# Patient Record
Sex: Male | Born: 1960
Health system: Southern US, Community
[De-identification: ages and names within clinical notes are randomized; demographics above are authoritative.]

## PROBLEM LIST (undated history)

## (undated) DIAGNOSIS — T7840XA Allergy, unspecified, initial encounter: Secondary | ICD-10-CM

## (undated) DIAGNOSIS — E785 Hyperlipidemia, unspecified: Secondary | ICD-10-CM

## (undated) DIAGNOSIS — H9319 Tinnitus, unspecified ear: Secondary | ICD-10-CM

## (undated) DIAGNOSIS — M542 Cervicalgia: Secondary | ICD-10-CM

## (undated) DIAGNOSIS — N200 Calculus of kidney: Secondary | ICD-10-CM

## (undated) HISTORY — DX: Allergy, unspecified, initial encounter: T78.40XA

## (undated) HISTORY — DX: Tinnitus, unspecified ear: H93.19

## (undated) HISTORY — DX: Cervicalgia: M54.2

## (undated) HISTORY — DX: Hyperlipidemia, unspecified: E78.5

---

## 1999-08-10 ENCOUNTER — Encounter: Payer: Self-pay | Admitting: Cardiology

## 1999-08-10 ENCOUNTER — Emergency Department (HOSPITAL_COMMUNITY): Admission: EM | Admit: 1999-08-10 | Discharge: 1999-08-10 | Payer: Self-pay | Admitting: Emergency Medicine

## 2000-12-07 ENCOUNTER — Encounter: Payer: Self-pay | Admitting: Emergency Medicine

## 2000-12-07 ENCOUNTER — Emergency Department (HOSPITAL_COMMUNITY): Admission: EM | Admit: 2000-12-07 | Discharge: 2000-12-07 | Payer: Self-pay | Admitting: Emergency Medicine

## 2002-04-22 ENCOUNTER — Encounter: Payer: Self-pay | Admitting: Emergency Medicine

## 2002-04-22 ENCOUNTER — Emergency Department (HOSPITAL_COMMUNITY): Admission: EM | Admit: 2002-04-22 | Discharge: 2002-04-22 | Payer: Self-pay | Admitting: *Deleted

## 2002-08-17 HISTORY — PX: OPEN REPAIR PERIARTICULAR FRACTURE / DISLOCATION ELBOW: SUR901

## 2008-11-01 ENCOUNTER — Emergency Department (HOSPITAL_COMMUNITY): Admission: EM | Admit: 2008-11-01 | Discharge: 2008-11-01 | Payer: Self-pay | Admitting: Emergency Medicine

## 2009-09-20 ENCOUNTER — Encounter: Admission: RE | Admit: 2009-09-20 | Discharge: 2009-09-20 | Payer: Self-pay | Admitting: Family Medicine

## 2010-06-04 ENCOUNTER — Ambulatory Visit (HOSPITAL_BASED_OUTPATIENT_CLINIC_OR_DEPARTMENT_OTHER): Admission: RE | Admit: 2010-06-04 | Discharge: 2010-06-04 | Payer: Self-pay | Admitting: Family Medicine

## 2010-06-04 ENCOUNTER — Ambulatory Visit: Payer: Self-pay | Admitting: Internal Medicine

## 2010-11-27 LAB — URINALYSIS, ROUTINE W REFLEX MICROSCOPIC
Bilirubin Urine: NEGATIVE
Ketones, ur: NEGATIVE mg/dL
Leukocytes, UA: NEGATIVE
Nitrite: NEGATIVE
Specific Gravity, Urine: 1.025 (ref 1.005–1.030)
Urobilinogen, UA: 0.2 mg/dL (ref 0.0–1.0)
pH: 7 (ref 5.0–8.0)

## 2010-11-27 LAB — URINE MICROSCOPIC-ADD ON

## 2010-11-27 LAB — POCT I-STAT, CHEM 8
Calcium, Ion: 1.2 mmol/L (ref 1.12–1.32)
HCT: 47 % (ref 39.0–52.0)
Hemoglobin: 16 g/dL (ref 13.0–17.0)
Sodium: 139 mEq/L (ref 135–145)
TCO2: 29 mmol/L (ref 0–100)

## 2010-11-27 LAB — DIFFERENTIAL
Basophils Absolute: 0 10*3/uL (ref 0.0–0.1)
Basophils Relative: 0 % (ref 0–1)
Eosinophils Absolute: 0 10*3/uL (ref 0.0–0.7)
Eosinophils Relative: 0 % (ref 0–5)
Monocytes Absolute: 0.2 10*3/uL (ref 0.1–1.0)

## 2010-11-27 LAB — CBC
HCT: 45.5 % (ref 39.0–52.0)
Hemoglobin: 15.2 g/dL (ref 13.0–17.0)
MCHC: 33.4 g/dL (ref 30.0–36.0)
Platelets: 220 10*3/uL (ref 150–400)
RDW: 13 % (ref 11.5–15.5)

## 2011-01-02 NOTE — Consult Note (Signed)
[facility]  Patient:    Chris Ali                       MRN: [id number] Proc. Date: 08/10/99 Adm. Date:  [id number] Attending:  Otilio Connors Iv CC:         Lewayne Bunting, M.D.                          Consultation Report  HISTORY OF PRESENT ILLNESS:  Mr. Micael Hampshire is a 50 year old white male with a history of vasodepressor syncope.  The patient presented to the emergency room today due to a feeling of dizziness and weakness over the last two days, particularly today hen the patient was standing for a long period of time in erect position in church.  The patient felt short of breath as well as dizzy; however, he never fainted. e has had no chest pain on exertion.  He does complain of some pleuritic pain on eep inspiration.  He does state that this pain improves on exertion.  The patient, about a week ago, climbed a 216-foot water tower and had absolutely no difficulty with this.  He has no orthopnea or PND.  He has had no palpitations.  PAST MEDICAL HISTORY:  Prior workup for dizziness approximately five years ago t Adult nurse office.  No further specific recommendations given.  ALLERGIES:  No known drug allergies.  MEDICATIONS:   Singulair and Claritin for questionable asthma.  SOCIAL HISTORY/FAMILY HISTORY:  The patient lives with his wife.  He does not smoke, occasionally drinks alcohol.  He denies any drug abuse.  There is no family history of sudden cardiac death.  There is no significant family history of coronary artery disease.  REVIEW OF SYSTEMS:  No fever or chills.  No hematemesis or hemoptysis.  PHYSICAL EXAMINATION:  VITAL SIGNS:  Blood pressure 120/72, heart rate 67 beats per minute, temperature afebrile.  NECK:  No JVD or abdominojugular reflux.  Normal carotid upstroke.  No carotid bruits.  Neck is supple.  LUNGS:  Clear breath sounds bilaterally.  HEART:  Regular rate and rhythm.  No murmurs, rubs, or  gallops.  ABDOMEN: Soft, nontender.  No rebound or guarding.  There are good bowel sounds.  EXTREMITIES:  Show 2+ peripheral pulses.  No cyanosis, clubbing, or edema.  GENITAL/RECTAL/NEUROLOGIC:  Examinations deferred.  LABORATORY DATA:  Glucose 89, BUN 11, sodium 140, potassium 3.7, chloride 104.  Anion gap 40.  Hematocrit 44, hemoglobin 15.  A pH of 7.4, PC02 40, bicarb 26, creatinine 0.2.  CK and CK-MB within normal limits with a total CK of 84. Relative index 0.8, troponin less than 0.03.  Chest x-ray within normal limits, no cardiomegaly, no pulmonary vascular congestion.  IMPRESSION AND PLAN: 1. Dizziness.  The patients symptoms are not consistent with angina.  He has no  evidence of myocardial injury by EKG.  He has negative troponins.  He has    limited cardiovascular risk factors.  The patient describes some substernal    chest pain which is rather pleuritic in nature, and I cannot exclude pleurisy in    this patient.  However, he had no rub on physical examination.  I have    instructed the patient to discontinue Singulair as this may be contributing o    dizziness.  Apparently the patient took a dose last night.  Without a firm    diagnosis of asthma,  I have instructed the patient that he should not be taking    this medication. 2. Disposition.  I have instructed the patient to take plenty of fluids, not to  use excessive alcohol in the next few days, and particularly avoid standing for  prolonged period of time in the same position.  I have also reassured him as he    appears to be anxious about his chest pain.  I have instructed him also to    follow up with me or to call be back as soon as possible if there are any    recurrent problems. DD:  08/10/99 TD:  08/11/99 Job: [id number]

## 2013-02-03 ENCOUNTER — Ambulatory Visit (INDEPENDENT_AMBULATORY_CARE_PROVIDER_SITE_OTHER): Payer: 59 | Admitting: Physician Assistant

## 2013-02-03 ENCOUNTER — Encounter: Payer: Self-pay | Admitting: Physician Assistant

## 2013-02-03 VITALS — BP 122/81 | HR 59 | Temp 97.7°F | Ht 66.0 in | Wt 166.0 lb

## 2013-02-03 DIAGNOSIS — H9319 Tinnitus, unspecified ear: Secondary | ICD-10-CM

## 2013-02-03 DIAGNOSIS — H9311 Tinnitus, right ear: Secondary | ICD-10-CM | POA: Insufficient documentation

## 2013-02-03 NOTE — Patient Instructions (Signed)
Tinnitus  Sounds you hear in your ears and coming from within the ear is called tinnitus. This can be a symptom of many ear disorders. It is often associated with hearing loss.   Tinnitus can be seen with:  · Infections.  · Ear blockages such as wax buildup.  · Meniere's disease.  · Ear damage.  · Inherited.  · Occupational causes.  While irritating, it is not usually a threat to health. When the cause of the tinnitus is wax, infection in the middle ear, or foreign body it is easily treated. Hearing loss will usually be reversible.   TREATMENT   When treating the underlying cause does not get rid of tinnitus, it may be necessary to get rid of the unwanted sound by covering it up with more pleasant background noises. This may include music, the radio etc. There are tinnitus maskers which can be worn which produce background noise to cover up the tinnitus.  Avoid all medications which tend to make tinnitus worse such as alcohol, caffeine, aspirin, and nicotine. There are many soothing background tapes such as rain, ocean, thunderstorms, etc. These soothing sounds help with sleeping or resting.  Keep all follow-up appointments and referrals. This is important to identify the cause of the problem. It also helps avoid complications, impaired hearing, disability, or chronic pain.  Document Released: 08/03/2005 Document Revised: 10/26/2011 Document Reviewed: 03/21/2008  ExitCare® Patient Information ©2014 [employer].

## 2013-02-03 NOTE — Progress Notes (Signed)
Subjective:     Patient ID: Chris Ali, male   DOB: Jan 31, 1961, 52 y.o.   MRN: [id number]  HPI Pt with a long intermit hx of tinnitus to the R ear Sx have become more freq recently He denies any definite hearing loss Pt with hx of being around loud noises No definite trauma to the ear itself  Review of Systems  All other systems reviewed and are negative.       Objective:   Physical Exam  Nursing note and vitals reviewed. Ears- ext ear nl bilat, canal nl, TM's with sl fluid but good landmarks Oral- no lesions No cerv nodes Hearing screen - see results       Assessment:     1. Tinnitus, right        Plan:     Refer to Audio for formalized testing F/U prn

## 2013-11-20 ENCOUNTER — Encounter: Payer: Self-pay | Admitting: Internal Medicine

## 2013-12-15 ENCOUNTER — Other Ambulatory Visit (INDEPENDENT_AMBULATORY_CARE_PROVIDER_SITE_OTHER): Payer: 59

## 2013-12-15 DIAGNOSIS — Z125 Encounter for screening for malignant neoplasm of prostate: Secondary | ICD-10-CM

## 2013-12-15 DIAGNOSIS — Z0000 Encounter for general adult medical examination without abnormal findings: Secondary | ICD-10-CM

## 2013-12-15 HISTORY — PX: COLONOSCOPY: SHX174

## 2013-12-15 LAB — POCT CBC
GRANULOCYTE PERCENT: 66.9 % (ref 37–80)
HCT, POC: 45.7 % (ref 43.5–53.7)
Hemoglobin: 14.9 g/dL (ref 14.1–18.1)
Lymph, poc: 1.5 (ref 0.6–3.4)
MCH, POC: 29.2 pg (ref 27–31.2)
MCHC: 32.7 g/dL (ref 31.8–35.4)
MCV: 89.4 fL (ref 80–97)
MPV: 8.9 fL (ref 0–99.8)
PLATELET COUNT, POC: 205 10*3/uL (ref 142–424)
POC GRANULOCYTE: 3.3 (ref 2–6.9)
POC LYMPH %: 30 % (ref 10–50)
RBC: 5.1 M/uL (ref 4.69–6.13)
RDW, POC: 12.9 %
WBC: 5 10*3/uL (ref 4.6–10.2)

## 2013-12-15 NOTE — Progress Notes (Signed)
Pt came in for labs only 

## 2013-12-16 LAB — BMP8+EGFR
BUN / CREAT RATIO: 15 (ref 9–20)
BUN: 15 mg/dL (ref 6–24)
CHLORIDE: 103 mmol/L (ref 97–108)
CO2: 23 mmol/L (ref 18–29)
Calcium: 9 mg/dL (ref 8.7–10.2)
Creatinine, Ser: 1.02 mg/dL (ref 0.76–1.27)
GFR calc Af Amer: 99 mL/min/{1.73_m2} (ref >59)
GFR calc non Af Amer: 85 mL/min/{1.73_m2} (ref >59)
GLUCOSE: 85 mg/dL (ref 65–99)
POTASSIUM: 4.2 mmol/L (ref 3.5–5.2)
Sodium: 140 mmol/L (ref 134–144)

## 2013-12-16 LAB — HEPATIC FUNCTION PANEL
ALBUMIN: 4.4 g/dL (ref 3.5–5.5)
ALT: 27 IU/L (ref 0–44)
AST: 20 IU/L (ref 0–40)
Alkaline Phosphatase: 47 IU/L (ref 39–117)
Bilirubin, Direct: 0.11 mg/dL (ref 0.00–0.40)
TOTAL PROTEIN: 6.9 g/dL (ref 6.0–8.5)
Total Bilirubin: 0.4 mg/dL (ref 0.0–1.2)

## 2013-12-16 LAB — PSA, TOTAL AND FREE
PSA FREE PCT: 15.7 %
PSA FREE: 0.11 ng/mL
PSA: 0.7 ng/mL (ref 0.0–4.0)

## 2013-12-16 LAB — NMR, LIPOPROFILE
Cholesterol: 198 mg/dL (ref <200)
HDL CHOLESTEROL BY NMR: 50 mg/dL (ref >=40)
HDL PARTICLE NUMBER: 29.8 umol/L — AB (ref >=30.5)
LDL PARTICLE NUMBER: 1392 nmol/L — AB (ref <1000)
LDL Size: 21.1 nm (ref >20.5)
LDLC SERPL CALC-MCNC: 135 mg/dL — AB (ref <100)
LP-IR SCORE: 31 (ref <=45)
Small LDL Particle Number: 388 nmol/L (ref <=527)
Triglycerides by NMR: 66 mg/dL (ref <150)

## 2013-12-16 LAB — VITAMIN D 25 HYDROXY (VIT D DEFICIENCY, FRACTURES): VIT D 25 HYDROXY: 22.5 ng/mL — AB (ref 30.0–100.0)

## 2013-12-21 ENCOUNTER — Ambulatory Visit (INDEPENDENT_AMBULATORY_CARE_PROVIDER_SITE_OTHER): Payer: 59 | Admitting: Family Medicine

## 2013-12-21 ENCOUNTER — Encounter: Payer: Self-pay | Admitting: Family Medicine

## 2013-12-21 VITALS — BP 127/82 | HR 61 | Temp 98.2°F | Ht 66.5 in | Wt 167.0 lb

## 2013-12-21 DIAGNOSIS — J309 Allergic rhinitis, unspecified: Secondary | ICD-10-CM

## 2013-12-21 DIAGNOSIS — Z0000 Encounter for general adult medical examination without abnormal findings: Secondary | ICD-10-CM

## 2013-12-21 DIAGNOSIS — N390 Urinary tract infection, site not specified: Secondary | ICD-10-CM

## 2013-12-21 DIAGNOSIS — R7989 Other specified abnormal findings of blood chemistry: Secondary | ICD-10-CM

## 2013-12-21 DIAGNOSIS — E559 Vitamin D deficiency, unspecified: Secondary | ICD-10-CM

## 2013-12-21 DIAGNOSIS — Z8249 Family history of ischemic heart disease and other diseases of the circulatory system: Secondary | ICD-10-CM

## 2013-12-21 DIAGNOSIS — N200 Calculus of kidney: Secondary | ICD-10-CM

## 2013-12-21 DIAGNOSIS — Z8719 Personal history of other diseases of the digestive system: Secondary | ICD-10-CM | POA: Insufficient documentation

## 2013-12-21 DIAGNOSIS — E785 Hyperlipidemia, unspecified: Secondary | ICD-10-CM

## 2013-12-21 LAB — POCT UA - MICROSCOPIC ONLY
CASTS, UR, LPF, POC: NEGATIVE
Crystals, Ur, HPF, POC: NEGATIVE
MUCUS UA: NEGATIVE
YEAST UA: NEGATIVE

## 2013-12-21 LAB — POCT URINALYSIS DIPSTICK
BILIRUBIN UA: NEGATIVE
GLUCOSE UA: NEGATIVE
Ketones, UA: NEGATIVE
NITRITE UA: NEGATIVE
Protein, UA: NEGATIVE
Spec Grav, UA: <=1.005
UROBILINOGEN UA: NEGATIVE
pH, UA: 8.5

## 2013-12-21 MED ORDER — VITAMIN D (ERGOCALCIFEROL) 1.25 MG (50000 UNIT) PO CAPS: 50000.0000 [IU] | ORAL_CAPSULE | ORAL | Status: DC

## 2013-12-21 MED ORDER — ROSUVASTATIN CALCIUM 10 MG PO TABS: 10.0000 mg | ORAL_TABLET | Freq: Every day | ORAL | Status: DC

## 2013-12-21 NOTE — Addendum Note (Signed)
Addended by: Pollyann Kennedy F on: 12/21/2013 11:35 AM   Modules accepted: Orders

## 2013-12-21 NOTE — Patient Instructions (Addendum)
Continue current medications. Continue good therapeutic lifestyle changes which include good diet and exercise. Fall precautions discussed with patient. If an FOBT was given today- please return it to our front desk. If you are over 51 years old - you may need Prevnar 48 or the adult Pneumonia vaccine.  Check on cost of shingles vaccine as well- ZOSTAVAX Vit D rx- sent to [facility] Please check your insurance further the Prevnar vaccine. Continued follow up with the audiologist We will arrange for a stress test. For your allergic rhinitis remember that Flonase and Nasacort are now over-the-counter

## 2013-12-21 NOTE — Progress Notes (Signed)
Subjective:    Patient ID: Chris Ali, male    DOB: [birth year], 53 y.o.   MRN: [id number]  HPI Patient is here today for annual wellness exam and follow up of chronic medical problems.the patient is to get a colonoscopy this month. He has had some back pain but this is better. He is new to get a cardiogram and a stress test. He is also due to get a Prevnar and shingles shot.          Patient Active Problem List   Diagnosis Date Noted  . Tinnitus of right ear 02/03/2013   Outpatient Encounter Prescriptions as of 12/21/2013  Medication Sig  . cetirizine (ZYRTEC) 10 MG tablet Take 10 mg by mouth at bedtime.    Review of Systems  Constitutional: Negative.   HENT: Negative.        Hearing loss right ear- has seen Cary Pahel  Eyes: Negative.   Respiratory: Negative.   Cardiovascular: Negative.   Gastrointestinal: Negative.   Endocrine: Negative.   Genitourinary: Negative.   Musculoskeletal: Positive for back pain.  Skin: Negative.   Allergic/Immunologic: Negative.   Neurological: Negative.   Hematological: Negative.   Psychiatric/Behavioral: Negative.        Objective:   Physical Exam  Nursing note and vitals reviewed. Constitutional: He is oriented to person, place, and time. He appears well-developed and well-nourished. No distress.  HENT:  Head: Normocephalic and atraumatic.  Right Ear: External ear normal.  Left Ear: External ear normal.  Nose: Nose normal.  Mouth/Throat: Oropharynx is clear and moist. No oropharyngeal exudate.  Minimal nasal congestion  Eyes: Conjunctivae and EOM are normal. Pupils are equal, round, and reactive to light. Right eye exhibits no discharge. Left eye exhibits no discharge. No scleral icterus.  Neck: Normal range of motion. Neck supple. No thyromegaly present.  No carotid bruit  Cardiovascular: Normal rate, regular rhythm, normal heart sounds and intact distal pulses.  Exam reveals no gallop and no friction rub.   No murmur  heard. Regular rate and rhythm at 72 per minute  Pulmonary/Chest: Effort normal and breath sounds normal. No respiratory distress. He has no wheezes. He has no rales. He exhibits no tenderness.  Abdominal: Soft. Bowel sounds are normal. He exhibits no mass. There is no tenderness. There is no rebound and no guarding.  Genitourinary: Rectum normal and penis normal.  The prostate is slightly enlarged. There are no lumps. There are no rectal masses. The external genitalia were within normal limits. There was no inguinal hernia palpated or inguinal nodes palpated.  Musculoskeletal: Normal range of motion. He exhibits no edema and no tenderness.  Lymphadenopathy:    He has no cervical adenopathy.  Neurological: He is alert and oriented to person, place, and time. He has normal reflexes. No cranial nerve deficit.  Skin: Skin is warm and dry. No rash noted. No erythema. No pallor.  Psychiatric: He has a normal mood and affect. His behavior is normal. Judgment and thought content normal.   BP 127/82  Pulse 61  Temp(Src) 98.2 F (36.8 C) (Oral)  Ht 5' 6.5" (1.689 m)  Wt 167 lb (75.751 kg)  BMI 26.55 kg/m2  EKG:  Bradycardia normal sinus rhythm       Assessment & Plan:  1. Annual physical exam - EKG 12-Lead - POCT UA - Microscopic Only - POCT urinalysis dipstick  2. Allergic rhinitis -Continue Zyrtec and as needed use of nasal steroid over the counter  3. Hyperlipidemia - Ambulatory  referral to Cardiology -Start Crestor 10 mg nightly -Liver function tests in 4 week  4. Nephrolithiasis  5. Vitamin D deficiency -Start vitamin D 50,000 units weekly  6. History of hiatal hernia  7. Family history of heart disease - Ambulatory referral to Cardiology  Meds ordered this encounter  Medications  . rosuvastatin (CRESTOR) 10 MG tablet    Sig: Take 1 tablet (10 mg total) by mouth daily.    Dispense:  90 tablet    Refill:  0  . Vitamin D, Ergocalciferol, (DRISDOL) 50000 UNITS CAPS  capsule    Sig: Take 1 capsule (50,000 Units total) by mouth every 7 (seven) days.    Dispense:  12 capsule    Refill:  1   Patient Instructions  Continue current medications. Continue good therapeutic lifestyle changes which include good diet and exercise. Fall precautions discussed with patient. If an FOBT was given today- please return it to our front desk. If you are over 17 years old - you may need Prevnar 62 or the adult Pneumonia vaccine.  Check on cost of shingles vaccine as well- ZOSTAVAX Vit D rx- sent to [place] Please check your insurance further the Prevnar vaccine. Continued follow up with the audiologist We will arrange for a stress test. For your allergic rhinitis remember that Flonase and Nasacort are now over-the-counter   Arrie Senate MD

## 2013-12-22 LAB — URINE CULTURE: Organism ID, Bacteria: NO GROWTH

## 2013-12-29 ENCOUNTER — Ambulatory Visit (AMBULATORY_SURGERY_CENTER): Payer: Self-pay | Admitting: *Deleted

## 2013-12-29 ENCOUNTER — Ambulatory Visit: Payer: Self-pay | Admitting: Cardiovascular Disease

## 2013-12-29 VITALS — Ht 66.5 in | Wt 170.6 lb

## 2013-12-29 DIAGNOSIS — Z1211 Encounter for screening for malignant neoplasm of colon: Secondary | ICD-10-CM

## 2013-12-29 MED ORDER — NA SULFATE-K SULFATE-MG SULF 17.5-3.13-1.6 GM/177ML PO SOLN: 1.0000 | Freq: Once | ORAL | Status: DC

## 2013-12-29 NOTE — Progress Notes (Signed)
Denies allergies to eggs or soy products. Denies complications with sedation or anesthesia. Denies O2 use. Denies use of diet or weight loss medications.  Emmi instructions given for colonoscopy.  

## 2014-01-03 ENCOUNTER — Encounter: Payer: Self-pay | Admitting: Internal Medicine

## 2014-01-05 ENCOUNTER — Encounter: Payer: Self-pay | Admitting: Cardiology

## 2014-01-05 ENCOUNTER — Ambulatory Visit (INDEPENDENT_AMBULATORY_CARE_PROVIDER_SITE_OTHER): Payer: 59 | Admitting: Cardiology

## 2014-01-05 VITALS — BP 127/77 | HR 54 | Ht 66.5 in | Wt 171.0 lb

## 2014-01-05 DIAGNOSIS — Z136 Encounter for screening for cardiovascular disorders: Secondary | ICD-10-CM

## 2014-01-05 DIAGNOSIS — E785 Hyperlipidemia, unspecified: Secondary | ICD-10-CM

## 2014-01-05 MED ORDER — ASPIRIN EC 81 MG PO TBEC: 81.0000 mg | DELAYED_RELEASE_TABLET | Freq: Every day | ORAL | Status: DC

## 2014-01-05 NOTE — Progress Notes (Signed)
Clinical Summary Chris Ali is a 53 y.o.male seen today as a new patient.   1. Cardiovascular risk assessment - Reports he runs up to a mile fairly regularly, typically runs 1/2 mile then stops for a break, and then runs second half. This is normal/stable for him.   Denies any chest pain. Denies any palpitations. No LE edema. No orthopnea.   CAD risk factors: hyperlipidemia, +FH Grandfather MI 58, maternal uncle MI 75. Remote brief history of tobacco history.   2. Hyperlipidemia - just started on crestor by pcp - 12/2013 panel: LDL 135 HDL 50 TG 66 TC 198   Past Medical History  Diagnosis Date  . Allergy   . Neck pain   . Tinnitus   . Hyperlipidemia      No Known Allergies   Current Outpatient Prescriptions  Medication Sig Dispense Refill  . cetirizine (ZYRTEC) 10 MG tablet Take 10 mg by mouth at bedtime.      . Na Sulfate-K Sulfate-Mg Sulf SOLN Take 1 kit by mouth once. suprep as directed. No substitutions.  354 mL  0  . rosuvastatin (CRESTOR) 10 MG tablet Take 1 tablet (10 mg total) by mouth daily.  90 tablet  0  . Vitamin D, Ergocalciferol, (DRISDOL) 50000 UNITS CAPS capsule Take 1 capsule (50,000 Units total) by mouth every 7 (seven) days.  12 capsule  1   No current facility-administered medications for this visit.     Past Surgical History  Procedure Laterality Date  . Open repair periarticular fracture / dislocation elbow Left 2004     No Known Allergies    Family History  Problem Relation Age of Onset  . Hypertension Mother   . Cancer Father     prostate  . Cancer Sister     breast  . Heart disease Maternal Grandmother   . Heart attack Maternal Grandmother 51  . Heart disease Maternal Grandfather   . Cancer Paternal Grandfather     colon  . Colon cancer Paternal Grandfather   . Esophageal cancer Neg Hx   . Rectal cancer Neg Hx   . Stomach cancer Neg Hx      Social History Chris Ali reports that he quit smoking about 25 years ago.  His smoking use included Cigarettes. He smoked 0.00 packs per day. He quit smokeless tobacco use about 25 years ago. Chris Ali reports that he drinks alcohol.   Review of Systems CONSTITUTIONAL: No weight loss, fever, chills, weakness or fatigue.  HEENT: Eyes: No visual loss, blurred vision, double vision or yellow sclerae.No hearing loss, sneezing, congestion, runny nose or sore throat.  SKIN: No rash or itching.  CARDIOVASCULAR: per HPI RESPIRATORY: No shortness of breath, cough or sputum.  GASTROINTESTINAL: No anorexia, nausea, vomiting or diarrhea. No abdominal pain or blood.  GENITOURINARY: No burning on urination, no polyuria NEUROLOGICAL: No headache, dizziness, syncope, paralysis, ataxia, numbness or tingling in the extremities. No change in bowel or bladder control.  MUSCULOSKELETAL: No muscle, back pain, joint pain or stiffness.  LYMPHATICS: No enlarged nodes. No history of splenectomy.  PSYCHIATRIC: No history of depression or anxiety.  ENDOCRINOLOGIC: No reports of sweating, cold or heat intolerance. No polyuria or polydipsia.  Marland Kitchen   Physical Examination p 54 bp 127/77 Wt 171 lbs BMI 27 Gen: resting comfortably, no acute distress HEENT: no scleral icterus, pupils equal round and reactive, no palptable cervical adenopathy,  CV: RRR, no m/r/g, no JVD, no carotid bruits Resp: Clear to auscultation bilaterally  GI: abdomen is soft, non-tender, non-distended, normal bowel sounds, no hepatosplenomegaly MSK: extremities are warm, no edema.  Skin: warm, no rash Neuro:  no focal deficits Psych: appropriate affect   Diagnostic Studies  12/21/13 EKG Sinus bradycardia rate 55   Assessment and Plan  1. Cardiovascular risk assessment - patient with fairly limited CAD risk factors, +FH, + HL, +gender/age.  - denies any current symptoms, he has stable exercise tolerance and tolerates high levels of exertion regularly with his running. - at this time focus of primary prevention is  risk factor modification. Agree with initiation of statin. Given his risk factors, will start ASA 39m daily. - no indication for non-invasive testing given the absence of any cardiac symptoms  2. Hyperlipidemia - initiated on statin just recently, followed by pcp Dr MLaurance Flatten  F/u 1 year      JArnoldo Lenis M.D., F.A.C.C.

## 2014-01-05 NOTE — Patient Instructions (Signed)
   Begin Aspirin 81mg daily.   Continue all other medications.   Your physician wants you to follow up in:  1 year.  You will receive a reminder letter in the mail one-two months in advance.  If you don't receive a letter, please call our office to schedule the follow up appointment   

## 2014-01-12 ENCOUNTER — Ambulatory Visit (AMBULATORY_SURGERY_CENTER): Payer: 59 | Admitting: Internal Medicine

## 2014-01-12 ENCOUNTER — Encounter: Payer: Self-pay | Admitting: Internal Medicine

## 2014-01-12 VITALS — BP 128/80 | HR 68 | Temp 97.2°F | Resp 15 | Ht 66.0 in | Wt 171.0 lb

## 2014-01-12 DIAGNOSIS — Z1211 Encounter for screening for malignant neoplasm of colon: Secondary | ICD-10-CM

## 2014-01-12 MED ORDER — SODIUM CHLORIDE 0.9 % IV SOLN: 500.0000 mL | INTRAVENOUS | Status: DC

## 2014-01-12 NOTE — Op Note (Signed)
[facility]  [address] [place], [address]   COLONOSCOPY PROCEDURE REPORT  PATIENT: Chris Ali, Chris Ali  MR#: [id number] BIRTHDATE: June 30, 1961 , 52  yrs. old GENDER: Male ENDOSCOPIST: Gatha Mayer, MD, [facility] PROCEDURE DATE:  01/12/2014 PROCEDURE:   Colonoscopy, screening First Screening Colonoscopy - Avg.  risk and is 50 yrs.  old or older Yes.  Prior Negative Screening - Now for repeat screening. N/A  History of Adenoma - Now for follow-up colonoscopy & has been > or = to 3 yrs.  N/A  Polyps Removed Today? No.  Recommend repeat exam, <10 yrs? No. ASA CLASS:   Class II INDICATIONS:average risk screening and first colonoscopy. MEDICATIONS: Propofol (Diprivan) 220 mg IV, MAC sedation, administered by CRNA, and These medications were titrated to patient response per physician's verbal order  DESCRIPTION OF PROCEDURE:   After the risks benefits and alternatives of the procedure were thoroughly explained, informed consent was obtained.  A digital rectal exam revealed no abnormalities of the rectum.   The LB [id number]  endoscope was introduced through the anus and advanced to the cecum, which was identified by both the appendix and ileocecal valve. No adverse events experienced.   The quality of the prep was excellent using Suprep  The instrument was then slowly withdrawn as the colon was fully examined.      COLON FINDINGS: A normal appearing cecum, ileocecal valve, and appendiceal orifice were identified.  The ascending, hepatic flexure, transverse, splenic flexure, descending, sigmoid colon and rectum appeared unremarkable.  No polyps or cancers were seen.   A right colon retroflexion was performed.  Retroflexed views revealed no abnormalities. The time to cecum=1 minutes 37 seconds. Withdrawal time=8 minutes 09 seconds.  The scope was withdrawn and the procedure completed. COMPLICATIONS: There were no complications.  ENDOSCOPIC IMPRESSION: Normal  colonoscopy - excellent prep - first screening  RECOMMENDATIONS: Repeat colonoscopy 10 years - 2025   eSigned:  Gatha Mayer, MD, [facility] 01/12/2014 3:34 PM   cc: The Patient and Redge Gainer, MD

## 2014-01-12 NOTE — Patient Instructions (Signed)
YOU HAD AN ENDOSCOPIC PROCEDURE TODAY AT THE [facility]: Refer to the procedure report that was given to you for any specific questions about what was found during the examination.  If the procedure report does not answer your questions, please call your gastroenterologist to clarify.  If you requested that your care partner not be given the details of your procedure findings, then the procedure report has been included in a sealed envelope for you to review at your convenience later.  YOU SHOULD EXPECT: Some feelings of bloating in the abdomen. Passage of more gas than usual.  Walking can help get rid of the air that was put into your GI tract during the procedure and reduce the bloating. If you had a lower endoscopy (such as a colonoscopy or flexible sigmoidoscopy) you may notice spotting of blood in your stool or on the toilet paper. If you underwent a bowel prep for your procedure, then you may not have a normal bowel movement for a few days.  DIET: Your first meal following the procedure should be a light meal and then it is ok to progress to your normal diet.  A half-sandwich or bowl of soup is an example of a good first meal.  Heavy or fried foods are harder to digest and may make you feel nauseous or bloated.  Likewise meals heavy in dairy and vegetables can cause extra gas to form and this can also increase the bloating.  Drink plenty of fluids but you should avoid alcoholic beverages for 24 hours.  ACTIVITY: Your care partner should take you home directly after the procedure.  You should plan to take it easy, moving slowly for the rest of the day.  You can resume normal activity the day after the procedure however you should NOT DRIVE or use heavy machinery for 24 hours (because of the sedation medicines used during the test).    SYMPTOMS TO REPORT IMMEDIATELY: A gastroenterologist can be reached at any hour.  During normal business hours, 8:30 AM to 5:00 PM Monday through Friday,  call [phone].  After hours and on weekends, please call the GI answering service at [phone] who will take a message and have the physician on call contact you.   Following lower endoscopy (colonoscopy or flexible sigmoidoscopy):  Excessive amounts of blood in the stool  Significant tenderness or worsening of abdominal pains  Swelling of the abdomen that is new, acute  Fever of 100F or higher  FOLLOW UP: If any biopsies were taken you will be contacted by phone or by letter within the next 1-3 weeks.  Call your gastroenterologist if you have not heard about the biopsies in 3 weeks.  Our staff will call the home number listed on your records the next business day following your procedure to check on you and address any questions or concerns that you may have at that time regarding the information given to you following your procedure. This is a courtesy call and so if there is no answer at the home number and we have not heard from you through the emergency physician on call, we will assume that you have returned to your regular daily activities without incident.  SIGNATURES/CONFIDENTIALITY: You and/or your care partner have signed paperwork which will be entered into your electronic medical record.  These signatures attest to the fact that that the information above on your After Visit Summary has been reviewed and is understood.  Full responsibility of the confidentiality of this   discharge information lies with you and/or your care-partner.    Normal colonoscopy.  Repeat colonoscopy in 10 years-2025

## 2014-01-12 NOTE — Progress Notes (Signed)
Report to PACU, RN, vss, BBS= Clear.  

## 2014-01-15 ENCOUNTER — Telehealth: Payer: Self-pay | Admitting: *Deleted

## 2014-01-15 NOTE — Telephone Encounter (Signed)
  Follow up Call-  Call back number 01/12/2014  Post procedure Call Back phone  # [phone]  Permission to leave phone message Yes     Patient questions:  Do you have a fever, pain , or abdominal swelling? no Pain Score  0 *  Have you tolerated food without any problems? yes  Have you been able to return to your normal activities? yes  Do you have any questions about your discharge instructions: Diet   no Medications  no Follow up visit  no  Do you have questions or concerns about your Care? no  Actions: * If pain score is 4 or above: No action needed, pain <4.

## 2014-01-15 NOTE — Telephone Encounter (Signed)
  Follow up Call-  Call back number 01/12/2014  Post procedure Call Back phone  # [phone]  Permission to leave phone message Yes     Patient questions:  Do you have a fever, pain , or abdominal swelling? no Pain Score  0 *  Have you tolerated food without any problems? yes  Have you been able to return to your normal activities? yes  Do you have any questions about your discharge instructions: Diet   no Medications  no Follow up visit  no  Do you have questions or concerns about your Care? no  Actions: * If pain score is 4 or above: No action needed, pain <4.   

## 2014-06-06 ENCOUNTER — Encounter: Payer: Self-pay | Admitting: Family Medicine

## 2014-06-06 ENCOUNTER — Ambulatory Visit (INDEPENDENT_AMBULATORY_CARE_PROVIDER_SITE_OTHER): Payer: 59 | Admitting: Family Medicine

## 2014-06-06 VITALS — BP 127/79 | HR 96 | Temp 98.4°F | Ht 66.0 in | Wt 168.0 lb

## 2014-06-06 DIAGNOSIS — J0100 Acute maxillary sinusitis, unspecified: Secondary | ICD-10-CM

## 2014-06-06 MED ORDER — AMOXICILLIN 875 MG PO TABS: 875.0000 mg | ORAL_TABLET | Freq: Two times a day (BID) | ORAL | Status: DC

## 2014-06-06 NOTE — Progress Notes (Signed)
Subjective:     Patient ID: Chris Ali, male   DOB: 08-19-1960, 53 y.o.   MRN: [id number]  HPI This 53 y.o. male presents for evaluation of c/o sinus congestion and facial discomfort. He has been having some mucopurulent drainage and Uri sx's for over 2 weeks.  Review of Systems C/o sinus congestion and facial discomfort   No chest pain, SOB, HA, dizziness, vision change, N/V, diarrhea, constipation, dysuria, urinary urgency or frequency, myalgias, arthralgias or rash.  Objective:   Physical Exam Vital signs noted  Well developed well nourished male.  HEENT - Head atraumatic Normocephalic                Eyes - PERRLA, Conjuctiva - clear Sclera- Clear EOMI                Ears - EAC's Wnl TM's Wnl Gross Hearing WNL                Nose - Nares patent                 Throat - oropharanx wnl                Face - TTP maxillary sinuses Respiratory - Lungs CTA bilateral Cardiac - RRR S1 and S2 without murmur GI - Abdomen soft Nontender and bowel sounds active x 4 Extremities - No edema. Neuro - Grossly intact.    Assessment:    Subacute maxillary sinusitis - Plan: amoxicillin (AMOXIL) 875 MG tablet Push po fluids, rest, tylenol and motrin otc prn as directed for fever, arthralgias, and myalgias.  Follow up prn if sx's continue or persist.  Lysbeth Penner FNP     Plan:       See above

## 2014-06-29 ENCOUNTER — Encounter (INDEPENDENT_AMBULATORY_CARE_PROVIDER_SITE_OTHER): Payer: Self-pay

## 2014-06-29 ENCOUNTER — Ambulatory Visit (INDEPENDENT_AMBULATORY_CARE_PROVIDER_SITE_OTHER): Payer: 59 | Admitting: Family Medicine

## 2014-06-29 ENCOUNTER — Encounter: Payer: Self-pay | Admitting: Family Medicine

## 2014-06-29 VITALS — BP 113/76 | HR 70 | Temp 97.7°F | Ht 66.0 in | Wt 167.0 lb

## 2014-06-29 DIAGNOSIS — E785 Hyperlipidemia, unspecified: Secondary | ICD-10-CM

## 2014-06-29 DIAGNOSIS — J301 Allergic rhinitis due to pollen: Secondary | ICD-10-CM

## 2014-06-29 DIAGNOSIS — M791 Myalgia, unspecified site: Secondary | ICD-10-CM

## 2014-06-29 DIAGNOSIS — Z8249 Family history of ischemic heart disease and other diseases of the circulatory system: Secondary | ICD-10-CM

## 2014-06-29 DIAGNOSIS — E559 Vitamin D deficiency, unspecified: Secondary | ICD-10-CM

## 2014-06-29 LAB — POCT CBC
Granulocyte percent: 68.1 %G (ref 37–80)
HEMATOCRIT: 44.6 % (ref 43.5–53.7)
HEMOGLOBIN: 14.8 g/dL (ref 14.1–18.1)
LYMPH, POC: 1.5 (ref 0.6–3.4)
MCH, POC: 29.4 pg (ref 27–31.2)
MCHC: 33.2 g/dL (ref 31.8–35.4)
MCV: 88.5 fL (ref 80–97)
MPV: 7.8 fL (ref 0–99.8)
POC GRANULOCYTE: 3.8 (ref 2–6.9)
POC LYMPH %: 27.2 % (ref 10–50)
Platelet Count, POC: 210 10*3/uL (ref 142–424)
RBC: 5 M/uL (ref 4.69–6.13)
RDW, POC: 13.6 %
WBC: 5.6 10*3/uL (ref 4.6–10.2)

## 2014-06-29 NOTE — Progress Notes (Signed)
Subjective:    Patient ID: Chris Ali, male    DOB: [birth year], 53 y.o.   MRN: [id number]  HPI Pt here for follow up and management of chronic medical problems.the patient developed myalgias with his statin drug and he discontinued it because of this reason. Both his parents have hyperlipidemia. We will see what his lab work is without taking a statin drug and consider trying another one if cholesterol numbers remain elevated.           Patient Active Problem List   Diagnosis Date Noted  . Allergic rhinitis 12/21/2013  . Hyperlipidemia 12/21/2013  . History of nephrolithiasis 12/21/2013  . Vitamin D deficiency 12/21/2013  . History of hiatal hernia 12/21/2013  . Tinnitus of right ear 02/03/2013   Outpatient Encounter Prescriptions as of 06/29/2014  Medication Sig  . cetirizine (ZYRTEC) 10 MG tablet Take 10 mg by mouth at bedtime.  . [DISCONTINUED] amoxicillin (AMOXIL) 875 MG tablet Take 1 tablet (875 mg total) by mouth 2 (two) times daily.  . [DISCONTINUED] aspirin EC 81 MG tablet Take 1 tablet (81 mg total) by mouth daily.    Review of Systems  Constitutional: Negative.   HENT: Negative.   Eyes: Negative.   Respiratory: Negative.   Cardiovascular: Negative.   Gastrointestinal: Negative.   Endocrine: Negative.   Genitourinary: Negative.   Musculoskeletal: Positive for myalgias (stopped cholesterol med- now better).  Skin: Negative.   Allergic/Immunologic: Negative.   Neurological: Negative.   Hematological: Negative.   Psychiatric/Behavioral: Negative.        Objective:   Physical Exam  Constitutional: He is oriented to person, place, and time. He appears well-developed and well-nourished. No distress.  HENT:  Head: Normocephalic and atraumatic.  Right Ear: External ear normal.  Left Ear: External ear normal.  Mouth/Throat: Oropharynx is clear and moist. No oropharyngeal exudate.  Minimal nasal congestion bilaterally  Eyes: Conjunctivae and EOM are  normal. Pupils are equal, round, and reactive to light. Right eye exhibits no discharge. Left eye exhibits no discharge. No scleral icterus.  Neck: Normal range of motion. Neck supple. No thyromegaly present.  No carotid bruits or anterior cervical adenopathy  Cardiovascular: Normal rate, regular rhythm and intact distal pulses.   No murmur heard. At 72/m  Pulmonary/Chest: Effort normal and breath sounds normal. No respiratory distress. He has no wheezes. He has no rales. He exhibits no tenderness.  Abdominal: Soft. Bowel sounds are normal. He exhibits no mass. There is no tenderness. There is no rebound and no guarding.  Musculoskeletal: Normal range of motion. He exhibits no edema or tenderness.  Lymphadenopathy:    He has no cervical adenopathy.  Neurological: He is alert and oriented to person, place, and time. He has normal reflexes.  Skin: Skin is warm and dry. No rash noted. No erythema. No pallor.  Psychiatric: He has a normal mood and affect. His behavior is normal. Judgment and thought content normal.  Nursing note and vitals reviewed.  BP 113/76 mmHg  Pulse 70  Temp(Src) 97.7 F (36.5 C) (Oral)  Ht _0  (1.676 m)  Wt 167 lb (75.751 kg)  BMI 26.97 kg/m2        Assessment & Plan:  1. Hyperlipidemia - POCT CBC - NMR, lipoprofile - BMP8+EGFR - Hepatic function panel  2. Vitamin D deficiency - POCT CBC - Vit D  25 hydroxy (rtn osteoporosis monitoring)  3. Family history of heart disease - POCT CBC - BMP8+EGFR - Hepatic function panel  4.  Myalgia - CK  5. Allergic rhinitis due to pollen -continue Zyrtec and periodic use of Flonase  6.statin intolerant  No orders of the defined types were placed in this encounter.   Patient Instructions  Continue current medications. Continue good therapeutic lifestyle changes which include good diet and exercise. Fall precautions discussed with patient. If an FOBT was given today- please return it to our front desk. If  you are over 24 years old - you may need Prevnar 47 or the adult Pneumonia vaccine.  Flu Shots will be available at our office starting mid- September. Please call and schedule a FLU CLINIC APPOINTMENT.   Continue aggressive therapeutic lifestyle changes which include diet and exercise We will call you with your lab work results and may consider trying a different statin drug to lower your cholesterol at that time Continue your current allergy medication and consider trying or adding Flonase in February and August for peak allergy control during those peak seasons.   Arrie Senate MD

## 2014-06-29 NOTE — Patient Instructions (Addendum)
Continue current medications. Continue good therapeutic lifestyle changes which include good diet and exercise. Fall precautions discussed with patient. If an FOBT was given today- please return it to our front desk. If you are over 51 years old - you may need Prevnar 36 or the adult Pneumonia vaccine.  Flu Shots will be available at our office starting mid- September. Please call and schedule a FLU CLINIC APPOINTMENT.   Continue aggressive therapeutic lifestyle changes which include diet and exercise We will call you with your lab work results and may consider trying a different statin drug to lower your cholesterol at that time Continue your current allergy medication and consider trying or adding Flonase in February and August for peak allergy control during those peak seasons.

## 2014-06-30 LAB — BMP8+EGFR
BUN/Creatinine Ratio: 11 (ref 9–20)
BUN: 13 mg/dL (ref 6–24)
CO2: 25 mmol/L (ref 18–29)
CREATININE: 1.14 mg/dL (ref 0.76–1.27)
Calcium: 9.5 mg/dL (ref 8.7–10.2)
Chloride: 100 mmol/L (ref 97–108)
GFR calc Af Amer: 86 mL/min/{1.73_m2} (ref >59)
GFR calc non Af Amer: 74 mL/min/{1.73_m2} (ref >59)
Glucose: 89 mg/dL (ref 65–99)
POTASSIUM: 4.1 mmol/L (ref 3.5–5.2)
SODIUM: 140 mmol/L (ref 134–144)

## 2014-06-30 LAB — NMR, LIPOPROFILE
CHOLESTEROL: 244 mg/dL — AB (ref 100–199)
HDL Cholesterol by NMR: 48 mg/dL (ref >39)
HDL PARTICLE NUMBER: 29.4 umol/L — AB (ref >=30.5)
LDL Particle Number: 1557 nmol/L — ABNORMAL HIGH (ref <1000)
LDL SIZE: 21.1 nm (ref >20.5)
LDL-C: 168 mg/dL — ABNORMAL HIGH (ref 0–99)
LP-IR Score: 41 (ref <=45)
Small LDL Particle Number: 213 nmol/L (ref <=527)
Triglycerides by NMR: 138 mg/dL (ref 0–149)

## 2014-06-30 LAB — HEPATIC FUNCTION PANEL
ALT: 41 IU/L (ref 0–44)
AST: 27 IU/L (ref 0–40)
Albumin: 4.4 g/dL (ref 3.5–5.5)
Alkaline Phosphatase: 53 IU/L (ref 39–117)
BILIRUBIN TOTAL: 0.4 mg/dL (ref 0.0–1.2)
Bilirubin, Direct: 0.11 mg/dL (ref 0.00–0.40)
Total Protein: 6.5 g/dL (ref 6.0–8.5)

## 2014-06-30 LAB — VITAMIN D 25 HYDROXY (VIT D DEFICIENCY, FRACTURES): Vit D, 25-Hydroxy: 23.5 ng/mL — ABNORMAL LOW (ref 30.0–100.0)

## 2014-06-30 LAB — CK: Total CK: 95 U/L (ref 24–204)

## 2014-09-18 ENCOUNTER — Encounter: Payer: Self-pay | Admitting: *Deleted

## 2014-11-07 ENCOUNTER — Other Ambulatory Visit: Payer: Self-pay

## 2014-11-07 MED ORDER — ROSUVASTATIN CALCIUM 10 MG PO TABS: 10.0000 mg | ORAL_TABLET | Freq: Every day | ORAL | Status: DC

## 2014-11-07 NOTE — Telephone Encounter (Signed)
Please give this patient a coupon for 3 months for $3 of Crestor with refills

## 2014-11-07 NOTE — Telephone Encounter (Signed)
Last seen 06/19/14 DWM  This med not on [facility] list  Patient requesting a generic substitute

## 2014-11-08 ENCOUNTER — Telehealth: Payer: Self-pay | Admitting: *Deleted

## 2014-11-08 MED ORDER — ATORVASTATIN CALCIUM 20 MG PO TABS: 20.0000 mg | ORAL_TABLET | Freq: Every day | ORAL | Status: DC

## 2014-11-08 NOTE — Telephone Encounter (Signed)
Change patient to atorvastatin 20 mg 1 daily. Check liver function tests in 4 weeks and likely med panel in 3 months

## 2014-11-08 NOTE — Telephone Encounter (Signed)
Stew at the pharmacy said the copay for 30 days is $182, even with the $3 card. Please change to atorvastatin or simvastatin

## 2014-12-27 ENCOUNTER — Ambulatory Visit: Payer: 59 | Admitting: Family Medicine

## 2014-12-28 ENCOUNTER — Ambulatory Visit (INDEPENDENT_AMBULATORY_CARE_PROVIDER_SITE_OTHER): Payer: 59 | Admitting: Family Medicine

## 2014-12-28 ENCOUNTER — Encounter: Payer: Self-pay | Admitting: Family Medicine

## 2014-12-28 ENCOUNTER — Ambulatory Visit (INDEPENDENT_AMBULATORY_CARE_PROVIDER_SITE_OTHER): Payer: 59

## 2014-12-28 ENCOUNTER — Encounter: Payer: Self-pay | Admitting: *Deleted

## 2014-12-28 VITALS — BP 125/79 | HR 66 | Temp 97.5°F | Ht 66.0 in | Wt 168.0 lb

## 2014-12-28 DIAGNOSIS — E559 Vitamin D deficiency, unspecified: Secondary | ICD-10-CM

## 2014-12-28 DIAGNOSIS — Z8249 Family history of ischemic heart disease and other diseases of the circulatory system: Secondary | ICD-10-CM | POA: Diagnosis not present

## 2014-12-28 DIAGNOSIS — Z0000 Encounter for general adult medical examination without abnormal findings: Secondary | ICD-10-CM

## 2014-12-28 DIAGNOSIS — E785 Hyperlipidemia, unspecified: Secondary | ICD-10-CM

## 2014-12-28 DIAGNOSIS — S76219A Strain of adductor muscle, fascia and tendon of unspecified thigh, initial encounter: Secondary | ICD-10-CM

## 2014-12-28 DIAGNOSIS — N400 Enlarged prostate without lower urinary tract symptoms: Secondary | ICD-10-CM

## 2014-12-28 LAB — POCT CBC
Granulocyte percent: 68 %G (ref 37–80)
HCT, POC: 47.3 % (ref 43.5–53.7)
Hemoglobin: 15.5 g/dL (ref 14.1–18.1)
Lymph, poc: 1.3 (ref 0.6–3.4)
MCH: 29.5 pg (ref 27–31.2)
MCHC: 32.7 g/dL (ref 31.8–35.4)
MCV: 90.1 fL (ref 80–97)
MPV: 8 fL (ref 0–99.8)
POC Granulocyte: 3.8 (ref 2–6.9)
POC LYMPH %: 23 % (ref 10–50)
Platelet Count, POC: 196 10*3/uL (ref 142–424)
RBC: 5.25 M/uL (ref 4.69–6.13)
RDW, POC: 13 %
WBC: 5.6 10*3/uL (ref 4.6–10.2)

## 2014-12-28 LAB — POCT URINALYSIS DIPSTICK
BILIRUBIN UA: NEGATIVE
Blood, UA: NEGATIVE
Glucose, UA: NEGATIVE
Ketones, UA: NEGATIVE
Leukocytes, UA: NEGATIVE
NITRITE UA: NEGATIVE
Spec Grav, UA: 1.01
UROBILINOGEN UA: NEGATIVE
pH, UA: 8.5

## 2014-12-28 LAB — POCT UA - MICROSCOPIC ONLY
Bacteria, U Microscopic: NEGATIVE
Casts, Ur, LPF, POC: NEGATIVE
Crystals, Ur, HPF, POC: NEGATIVE
WBC, Ur, HPF, POC: NEGATIVE
Yeast, UA: NEGATIVE

## 2014-12-28 NOTE — Progress Notes (Signed)
Subjective:    Patient ID: Chris Ali, male    DOB: 01/09/1961, 54 y.o.   MRN: [id number]  HPI Patient is here today for annual wellness exam and follow up of chronic medical problems which includes hyperlipidemia and vit d def. the patient also complains of a knot in the left testicle. The patient denies chest pain shortness of breath trouble swallowing indigestion and heartburn blood in the stool or black tarry bowel movements. He has no problems voiding and his sexual function is normal. He indicates that 2-3 days ago that he did a lot of pushing and straining and subsequently noted the soreness in his left testicle area. He said this happened once before and got better on its own.     Patient Active Problem List   Diagnosis Date Noted  . Allergic rhinitis 12/21/2013  . Hyperlipidemia 12/21/2013  . History of nephrolithiasis 12/21/2013  . Vitamin D deficiency 12/21/2013  . Hiatal hernia 12/21/2013  . Tinnitus of right ear 02/03/2013   Outpatient Encounter Prescriptions as of 12/28/2014  Medication Sig  . atorvastatin (LIPITOR) 20 MG tablet Take 1 tablet (20 mg total) by mouth daily.  . cetirizine (ZYRTEC) 10 MG tablet Take 10 mg by mouth at bedtime.  . [DISCONTINUED] rosuvastatin (CRESTOR) 10 MG tablet Take 1 tablet (10 mg total) by mouth daily.   No facility-administered encounter medications on file as of 12/28/2014.      Review of Systems  Constitutional: Negative.   HENT: Negative.   Eyes: Negative.   Respiratory: Negative.   Cardiovascular: Negative.   Gastrointestinal: Negative.   Endocrine: Negative.   Genitourinary: Negative.        Knot left testicle.  Musculoskeletal: Negative.   Skin: Negative.   Allergic/Immunologic: Negative.   Neurological: Negative.   Hematological: Negative.   Psychiatric/Behavioral: Negative.        Objective:   Physical Exam  Constitutional: He is oriented to person, place, and time. He appears well-developed and  well-nourished. No distress.  HENT:  Head: Normocephalic and atraumatic.  Right Ear: External ear normal.  Left Ear: External ear normal.  Mouth/Throat: Oropharynx is clear and moist. No oropharyngeal exudate.  Nasal congestion bilaterally  Eyes: Conjunctivae and EOM are normal. Pupils are equal, round, and reactive to light. Right eye exhibits no discharge. Left eye exhibits no discharge. No scleral icterus.  Neck: Normal range of motion. Neck supple. No thyromegaly present.  No anterior cervical adenopathy or bruits  Cardiovascular: Normal rate, regular rhythm, normal heart sounds and intact distal pulses.  Exam reveals no gallop and no friction rub.   No murmur heard. The rhythm is regular at 72/m  Pulmonary/Chest: Effort normal and breath sounds normal. No respiratory distress. He has no wheezes. He has no rales. He exhibits no tenderness.  Lungs are clear anteriorly and posteriorly  Abdominal: Soft. Bowel sounds are normal. He exhibits no mass. There is no tenderness. There is no rebound and no guarding.  Abdomen was nontender without masses tenderness or organ enlargement or bruits. There was no inguinal adenopathy  Genitourinary: Rectum normal and penis normal.  The prostate is minimally enlarged. There were no rectal masses and no prostate lumps or masses. The external genitalia were within normal limits. There was slight tenderness in the left super testicular area without inguinal hernia bilaterally. There was no obvious swelling apparent.  Musculoskeletal: Normal range of motion. He exhibits no edema or tenderness.  Lymphadenopathy:    He has no cervical adenopathy.  Neurological:  He is alert and oriented to person, place, and time. He has normal reflexes. No cranial nerve deficit.  Skin: Skin is warm and dry. No rash noted. No erythema. No pallor.  Psychiatric: He has a normal mood and affect. His behavior is normal. Judgment and thought content normal.  Nursing note and vitals  reviewed.  BP 125/79 mmHg  Pulse 66  Temp(Src) 97.5 F (36.4 C) (Oral)  Ht 5' 6"  (1.676 m)  Wt 168 lb (76.204 kg)  BMI 27.13 kg/m2        Assessment & Plan:  1. Hyperlipidemia -The patient will continue with his current treatment pending results of lab work - POCT CBC - Hepatic function panel - NMR, lipoprofile  2. Vitamin D deficiency -Continue with current treatment pending results of lab work - POCT CBC - Vit D  25 hydroxy (rtn osteoporosis monitoring)  3. Family history of heart disease -The patient has no chest pain and is uncertain when he had his last treadmill test. We will look into this and call him if it has been 5 years or more since this was done. - POCT CBC - BMP8+EGFR - Hepatic function panel  4. Annual physical exam -His colonoscopy is up-to-date. He had an EKG last year and we will not do one this year. - POCT CBC - POCT UA - Microscopic Only - POCT urinalysis dipstick - BMP8+EGFR - Hepatic function panel - NMR, lipoprofile - PSA, total and free - Vit D  25 hydroxy (rtn osteoporosis monitoring)  5. Groin strain, initial encounter -He will take ibuprofen 200 mg 3 times a day after meals or Aleve 1 twice a day after breakfast and supper for the next 7-10 days and if this discomfort is not improved after that he will get back in touch with [place] for a recheck.  6. BPH (benign prostatic hyperplasia) -He is having no problems with voiding and this enlargement is only very mild.  Patient Instructions  Continue current medications. Continue good therapeutic lifestyle changes which include good diet and exercise. Fall precautions discussed with patient. If an FOBT was given today- please return it to our front desk. If you are over 37 years old - you may need Prevnar 66 or the adult Pneumonia vaccine.  Flu Shots are still available at our office. If you still haven't had one please call to set up a nurse visit to get one.   After your visit with [place]  today you will receive a survey in the mail or online from [employer] regarding your care with [place]. Please take a moment to fill this out. Your feedback is very important to [place] as you can help [place] better understand your patient needs as well as improve your experience and satisfaction. WE CARE ABOUT YOU!!!   The was no lump or hernia felt in the left groin. There was just tenderness. And the patient should take Aleve or ibuprofen for 7-10 days after eating and if the soreness does not resolve he should return to clinic for recheck and possibly get an ultrasound   Arrie Senate MD

## 2014-12-28 NOTE — Addendum Note (Signed)
Addended by: Zannie Cove on: 12/28/2014 09:29 AM   Modules accepted: Orders

## 2014-12-28 NOTE — Patient Instructions (Addendum)
Continue current medications. Continue good therapeutic lifestyle changes which include good diet and exercise. Fall precautions discussed with patient. If an FOBT was given today- please return it to our front desk. If you are over 52 years old - you may need Prevnar 52 or the adult Pneumonia vaccine.  Flu Shots are still available at our office. If you still haven't had one please call to set up a nurse visit to get one.   After your visit with [place] today you will receive a survey in the mail or online from [employer] regarding your care with [place]. Please take a moment to fill this out. Your feedback is very important to [place] as you can help [place] better understand your patient needs as well as improve your experience and satisfaction. WE CARE ABOUT YOU!!!   The was no lump or hernia felt in the left groin. There was just tenderness. And the patient should take Aleve or ibuprofen for 7-10 days after eating and if the soreness does not resolve he should return to clinic for recheck and possibly get an ultrasound

## 2014-12-29 LAB — NMR, LIPOPROFILE
Cholesterol: 133 mg/dL (ref 100–199)
HDL Cholesterol by NMR: 51 mg/dL (ref >39)
HDL Particle Number: 34.8 umol/L (ref >=30.5)
LDL Particle Number: 650 nmol/L (ref <1000)
LDL SIZE: 20.4 nm (ref >20.5)
LDL-C: 60 mg/dL (ref 0–99)
LP-IR Score: 57 — ABNORMAL HIGH (ref <=45)
SMALL LDL PARTICLE NUMBER: 230 nmol/L (ref <=527)
Triglycerides by NMR: 112 mg/dL (ref 0–149)

## 2014-12-29 LAB — BMP8+EGFR
BUN/Creatinine Ratio: 11 (ref 9–20)
BUN: 11 mg/dL (ref 6–24)
CO2: 27 mmol/L (ref 18–29)
CREATININE: 1.02 mg/dL (ref 0.76–1.27)
Calcium: 9.7 mg/dL (ref 8.7–10.2)
Chloride: 101 mmol/L (ref 97–108)
GFR calc Af Amer: 98 mL/min/{1.73_m2} (ref >59)
GFR, EST NON AFRICAN AMERICAN: 85 mL/min/{1.73_m2} (ref >59)
GLUCOSE: 89 mg/dL (ref 65–99)
POTASSIUM: 4.7 mmol/L (ref 3.5–5.2)
Sodium: 143 mmol/L (ref 134–144)

## 2014-12-29 LAB — HEPATIC FUNCTION PANEL
ALT: 43 IU/L (ref 0–44)
AST: 29 IU/L (ref 0–40)
Albumin: 4.3 g/dL (ref 3.5–5.5)
Alkaline Phosphatase: 59 IU/L (ref 39–117)
BILIRUBIN, DIRECT: 0.14 mg/dL (ref 0.00–0.40)
Bilirubin Total: 0.4 mg/dL (ref 0.0–1.2)
Total Protein: 6.9 g/dL (ref 6.0–8.5)

## 2014-12-29 LAB — PSA, TOTAL AND FREE
PSA FREE PCT: 17.8 %
PSA, Free: 0.16 ng/mL
Prostate Specific Ag, Serum: 0.9 ng/mL (ref 0.0–4.0)

## 2014-12-29 LAB — VITAMIN D 25 HYDROXY (VIT D DEFICIENCY, FRACTURES): Vit D, 25-Hydroxy: 30.7 ng/mL (ref 30.0–100.0)

## 2014-12-31 ENCOUNTER — Telehealth: Payer: Self-pay | Admitting: *Deleted

## 2014-12-31 NOTE — Telephone Encounter (Signed)
lmtcb regarding test results. 

## 2014-12-31 NOTE — Telephone Encounter (Signed)
-----   Message from Chipper Herb, MD sent at 12/30/2014 10:49 AM EDT ----- The blood sugar was good at 89. The creatinine, the most important kidney function test was within normal limits. The electrolytes including potassium are good. All liver function tests were within normal limits Cholesterol numbers with advanced lipid testing were much improved with a total LDL particle number being 650. The LDL C was also greatly improved with that number being 60 and the goal being less than 100. The triglycerides were also good. The patient should continue with his current dose of atorvastatin and with as aggressive therapeutic lifestyle changes as possible which include diet and exercise The PSA test remains low and within normal limits. The vitamin D level is 30.7 and this is at the low end of the normal range. Please encourage the patient to take vitamin D3 1000 1 daily, the now brand, which can be purchased at [facility] in [place] -April please note that the previous PSAs are not being pulled into the record of reporting!!!!!!

## 2015-01-01 NOTE — Progress Notes (Signed)
Patient aware.

## 2015-04-25 ENCOUNTER — Encounter: Payer: Self-pay | Admitting: *Deleted

## 2015-06-03 ENCOUNTER — Other Ambulatory Visit: Payer: Self-pay | Admitting: Family Medicine

## 2015-12-30 ENCOUNTER — Ambulatory Visit: Payer: 59 | Admitting: Family Medicine

## 2016-01-01 ENCOUNTER — Encounter: Payer: Self-pay | Admitting: Family Medicine

## 2016-01-01 ENCOUNTER — Ambulatory Visit (INDEPENDENT_AMBULATORY_CARE_PROVIDER_SITE_OTHER): Payer: 59 | Admitting: Family Medicine

## 2016-01-01 ENCOUNTER — Ambulatory Visit (INDEPENDENT_AMBULATORY_CARE_PROVIDER_SITE_OTHER): Payer: 59

## 2016-01-01 VITALS — BP 117/76 | HR 64 | Temp 97.7°F | Ht 66.0 in | Wt 169.0 lb

## 2016-01-01 DIAGNOSIS — Z0000 Encounter for general adult medical examination without abnormal findings: Secondary | ICD-10-CM | POA: Diagnosis not present

## 2016-01-01 DIAGNOSIS — G629 Polyneuropathy, unspecified: Secondary | ICD-10-CM

## 2016-01-01 DIAGNOSIS — M542 Cervicalgia: Secondary | ICD-10-CM | POA: Diagnosis not present

## 2016-01-01 DIAGNOSIS — Z23 Encounter for immunization: Secondary | ICD-10-CM | POA: Diagnosis not present

## 2016-01-01 DIAGNOSIS — Z1211 Encounter for screening for malignant neoplasm of colon: Secondary | ICD-10-CM

## 2016-01-01 DIAGNOSIS — J301 Allergic rhinitis due to pollen: Secondary | ICD-10-CM

## 2016-01-01 DIAGNOSIS — Z8249 Family history of ischemic heart disease and other diseases of the circulatory system: Secondary | ICD-10-CM | POA: Diagnosis not present

## 2016-01-01 DIAGNOSIS — N400 Enlarged prostate without lower urinary tract symptoms: Secondary | ICD-10-CM

## 2016-01-01 DIAGNOSIS — E559 Vitamin D deficiency, unspecified: Secondary | ICD-10-CM

## 2016-01-01 DIAGNOSIS — E785 Hyperlipidemia, unspecified: Secondary | ICD-10-CM | POA: Diagnosis not present

## 2016-01-01 LAB — URINALYSIS, COMPLETE
BILIRUBIN UA: NEGATIVE
Glucose, UA: NEGATIVE
LEUKOCYTES UA: NEGATIVE
Nitrite, UA: NEGATIVE
PH UA: 6 (ref 5.0–7.5)
RBC, UA: NEGATIVE
SPEC GRAV UA: 1.025 (ref 1.005–1.030)
Urobilinogen, Ur: 1 mg/dL (ref 0.2–1.0)

## 2016-01-01 LAB — MICROSCOPIC EXAMINATION
EPITHELIAL CELLS (NON RENAL): NONE SEEN /HPF (ref 0–10)
RBC MICROSCOPIC, UA: NONE SEEN /HPF (ref 0–?)
WBC, UA: NONE SEEN /hpf (ref 0–?)

## 2016-01-01 NOTE — Progress Notes (Signed)
Subjective:    Patient ID: Chris Ali, male    DOB: 21-Oct-1960, 55 y.o.   MRN: [id number]  HPI Patient is here today for annual wellness exam and follow up of chronic medical problems which includes hyperlipidemia. He is taking medications regularly.The patient has no specific problems other than some neck pain and arm numbness. He is due for lab work and EKG and urinalysis. His also due to get a TD. The patient today does describe some neck pain which is been worse for several months with bilateral arm weakness and numbness. One arm is no worse than the other. It may be worse after doing a lot of lifting and he does take ibuprofen with some relief. It is worse in the evenings. It is worse with certain positions in the bed in certain positions in the bed can relieve the pain. The patient denies any chest pain chest tightness shortness of breath trouble swallowing heartburn indigestion nausea vomiting diarrhea blood in the stool change in bowel habits or black tarry bowel movements. He is passing his water without problems and denies any blood in the urine. He has no sexual dysfunction. He does have a history of reflux disease but currently is not having any problems with that. He does have his eyes checked regularly. There is a strong family history of prostate cancer heart disease and lung cancer and COPD. This patient does not smoke.      Patient Active Problem List   Diagnosis Date Noted  . Allergic rhinitis 12/21/2013  . Hyperlipidemia 12/21/2013  . History of nephrolithiasis 12/21/2013  . Vitamin D deficiency 12/21/2013  . Hiatal hernia 12/21/2013  . Tinnitus of right ear 02/03/2013   Outpatient Encounter Prescriptions as of 01/01/2016  Medication Sig  . atorvastatin (LIPITOR) 20 MG tablet TAKE 1 TABLET DAILY  . cetirizine (ZYRTEC) 10 MG tablet Take 10 mg by mouth at bedtime.   No facility-administered encounter medications on file as of 01/01/2016.     Review of Systems    Constitutional: Negative.   HENT: Negative.   Eyes: Negative.   Respiratory: Negative.   Cardiovascular: Negative.   Gastrointestinal: Negative.   Endocrine: Negative.   Genitourinary: Negative.   Musculoskeletal: Positive for neck pain.  Skin: Negative.   Allergic/Immunologic: Negative.   Neurological: Negative.   Hematological: Negative.   Psychiatric/Behavioral: Negative.        Objective:   Physical Exam  Constitutional: He is oriented to person, place, and time. He appears well-developed and well-nourished. No distress.  HENT:  Head: Normocephalic and atraumatic.  Right Ear: External ear normal.  Left Ear: External ear normal.  Nose: Nose normal.  Mouth/Throat: Oropharynx is clear and moist. No oropharyngeal exudate.  Eyes: Conjunctivae and EOM are normal. Pupils are equal, round, and reactive to light. Right eye exhibits no discharge. Left eye exhibits no discharge. No scleral icterus.  Neck: Normal range of motion. Neck supple. No thyromegaly present.  No bruits or thyromegaly or adenopathy  Cardiovascular: Normal rate, regular rhythm, normal heart sounds and intact distal pulses.   No murmur heard. Rhythm is regular at 72/m  Pulmonary/Chest: Effort normal and breath sounds normal. No respiratory distress. He has no wheezes. He has no rales. He exhibits no tenderness.  Clear anteriorly and posteriorly without axillary adenopathy  Abdominal: Soft. Bowel sounds are normal. He exhibits no mass. There is no tenderness. There is no rebound and no guarding.  No spleen or liver enlargement no bruits and no masses palpable  no inguinal adenopathy  Genitourinary: Rectum normal and penis normal.  The prostate gland is slightly enlarged but soft and smooth. There are no rectal masses. There are no inguinal hernias palpable. External genitalia were within normal limits.  Musculoskeletal: Normal range of motion. He exhibits no edema or tenderness.  Because of a fall about 5 years  ago the patient does not have the ability to fully extend the left arm but has no discomfort with his elbow.  Lymphadenopathy:    He has no cervical adenopathy.  Neurological: He is alert and oriented to person, place, and time. He has normal reflexes. No cranial nerve deficit.  Skin: Skin is warm and dry. No rash noted.  Psychiatric: He has a normal mood and affect. His behavior is normal. Judgment and thought content normal.  Nursing note and vitals reviewed.  BP 117/76 mmHg  Pulse 64  Temp(Src) 97.7 F (36.5 C) (Oral)  Ht _0  (1.676 m)  Wt 169 lb (76.658 kg)  BMI 27.29 kg/m2  EKG: Normal limits  [facility] reading (PRIMARY) by  Dr.Moore-C-spine films, results pending--                                        Assessment & Plan:  1. Annual physical exam -T-dap--To be given today. - BMP8+EGFR - CBC with Differential/Platelet - Hepatic function panel - Fecal occult blood, imunochemical; Future - NMR, lipoprofile - PSA, total and free - VITAMIN D 25 Hydroxy (Vit-D Deficiency, Fractures) - Urinalysis, Complete - EKG 12-Lead  2. Hyperlipidemia -The patient will continue with aggressive therapeutic lifestyle changes pending results of lab work - BMP8+EGFR - CBC with Differential/Platelet - Hepatic function panel - NMR, lipoprofile - EKG 12-Lead  3. Vitamin D deficiency -Currently on no vitamin D replacement - CBC with Differential/Platelet - VITAMIN D 25 Hydroxy (Vit-D Deficiency, Fractures)  4. Family history of heart disease -We will arrange for the patient to have an exercise treadmill test - CBC with Differential/Platelet - EKG 12-Lead  5. BPH (benign prostatic hyperplasia) -There is mild enlargement with the prostate but the patient is having no symptoms with this. No significant change from the previous exam - CBC with Differential/Platelet - PSA, total and free - Urinalysis, Complete  6. Special screening for malignant neoplasms, colon -No history of any  blood in the stool and he is 2 years past his colonoscopy - Fecal occult blood, imunochemical; Future  7. Bilateral neck pain -C-spine films -Continue with ibuprofen  8. Neuropathy (HCC) -C-spine films today  9. Allergic rhinitis due to pollen, unspecified rhinitis seasonality -Continue with allergen avoidance and Zyrtec as needed  Patient Instructions  Continue current medications. Continue good therapeutic lifestyle changes which include good diet and exercise. Fall precautions discussed with patient. If an FOBT was given today- please return it to our front desk. If you are over 79 years old - you may need Prevnar 48 or the adult Pneumonia vaccine.  **Flu shots are available--- please call and schedule a FLU-CLINIC appointment**  After your visit with [place] today you will receive a survey in the mail or online from [employer] regarding your care with [place]. Please take a moment to fill this out. Your feedback is very important to [place] as you can help [place] better understand your patient needs as well as improve your experience and satisfaction. WE CARE ABOUT YOU!!!   The patient should avoid  irritating environments as much as possible as far as his allergies are concerned He should stay well hydrated and drink plenty of fluids We will get C-spine films and if necessary an MRI to further evaluate the neck pain and arm numbness and weakness We will arrange for him to have an exercise treadmill test with one of the providers in our office Please return the FOBT We will call with lab work results as soon as they become available   Arrie Senate MD

## 2016-01-01 NOTE — Patient Instructions (Addendum)
Continue current medications. Continue good therapeutic lifestyle changes which include good diet and exercise. Fall precautions discussed with patient. If an FOBT was given today- please return it to our front desk. If you are over 53 years old - you may need Prevnar 15 or the adult Pneumonia vaccine.  **Flu shots are available--- please call and schedule a FLU-CLINIC appointment**  After your visit with [place] today you will receive a survey in the mail or online from [employer] regarding your care with [place]. Please take a moment to fill this out. Your feedback is very important to [place] as you can help [place] better understand your patient needs as well as improve your experience and satisfaction. WE CARE ABOUT YOU!!!   The patient should avoid irritating environments as much as possible as far as his allergies are concerned He should stay well hydrated and drink plenty of fluids We will get C-spine films and if necessary an MRI to further evaluate the neck pain and arm numbness and weakness We will arrange for him to have an exercise treadmill test with one of the providers in our office Please return the FOBT We will call with lab work results as soon as they become available

## 2016-01-02 LAB — CBC WITH DIFFERENTIAL/PLATELET
BASOS ABS: 0 10*3/uL (ref 0.0–0.2)
BASOS: 1 %
EOS (ABSOLUTE): 0.1 10*3/uL (ref 0.0–0.4)
Eos: 2 %
Hematocrit: 44 % (ref 37.5–51.0)
Hemoglobin: 15.1 g/dL (ref 12.6–17.7)
Immature Grans (Abs): 0 10*3/uL (ref 0.0–0.1)
Immature Granulocytes: 0 %
Lymphocytes Absolute: 1.7 10*3/uL (ref 0.7–3.1)
Lymphs: 31 %
MCH: 31 pg (ref 26.6–33.0)
MCHC: 34.3 g/dL (ref 31.5–35.7)
MCV: 90 fL (ref 79–97)
MONOS ABS: 0.4 10*3/uL (ref 0.1–0.9)
Monocytes: 7 %
NEUTROS PCT: 59 %
Neutrophils Absolute: 3.2 10*3/uL (ref 1.4–7.0)
PLATELETS: 220 10*3/uL (ref 150–379)
RBC: 4.87 x10E6/uL (ref 4.14–5.80)
RDW: 13.8 % (ref 12.3–15.4)
WBC: 5.4 10*3/uL (ref 3.4–10.8)

## 2016-01-02 LAB — BMP8+EGFR
BUN/Creatinine Ratio: 16 (ref 9–20)
BUN: 15 mg/dL (ref 6–24)
CALCIUM: 9.4 mg/dL (ref 8.7–10.2)
CHLORIDE: 103 mmol/L (ref 96–106)
CO2: 24 mmol/L (ref 18–29)
Creatinine, Ser: 0.95 mg/dL (ref 0.76–1.27)
GFR calc Af Amer: 106 mL/min/{1.73_m2} (ref >59)
GFR, EST NON AFRICAN AMERICAN: 92 mL/min/{1.73_m2} (ref >59)
GLUCOSE: 87 mg/dL (ref 65–99)
POTASSIUM: 4.3 mmol/L (ref 3.5–5.2)
SODIUM: 143 mmol/L (ref 134–144)

## 2016-01-02 LAB — NMR, LIPOPROFILE
Cholesterol: 140 mg/dL (ref 100–199)
HDL CHOLESTEROL BY NMR: 52 mg/dL (ref >39)
HDL Particle Number: 31.8 umol/L (ref >=30.5)
LDL PARTICLE NUMBER: 821 nmol/L (ref <1000)
LDL Size: 21 nm (ref >20.5)
LDL-C: 65 mg/dL (ref 0–99)
LP-IR Score: 50 — ABNORMAL HIGH (ref <=45)
Small LDL Particle Number: 308 nmol/L (ref <=527)
TRIGLYCERIDES BY NMR: 115 mg/dL (ref 0–149)

## 2016-01-02 LAB — VITAMIN D 25 HYDROXY (VIT D DEFICIENCY, FRACTURES): Vit D, 25-Hydroxy: 24.8 ng/mL — ABNORMAL LOW (ref 30.0–100.0)

## 2016-01-02 LAB — PSA, TOTAL AND FREE
PSA FREE PCT: 24 %
PSA FREE: 0.24 ng/mL
Prostate Specific Ag, Serum: 1 ng/mL (ref 0.0–4.0)

## 2016-01-02 LAB — HEPATIC FUNCTION PANEL
ALT: 44 IU/L (ref 0–44)
AST: 29 IU/L (ref 0–40)
Albumin: 4.4 g/dL (ref 3.5–5.5)
Alkaline Phosphatase: 57 IU/L (ref 39–117)
BILIRUBIN TOTAL: 0.5 mg/dL (ref 0.0–1.2)
Bilirubin, Direct: 0.13 mg/dL (ref 0.00–0.40)
TOTAL PROTEIN: 6.9 g/dL (ref 6.0–8.5)

## 2016-01-02 MED ORDER — VITAMIN D (ERGOCALCIFEROL) 1.25 MG (50000 UNIT) PO CAPS: 50000.0000 [IU] | ORAL_CAPSULE | ORAL | Status: DC

## 2016-01-02 NOTE — Addendum Note (Signed)
Addended by: Nigel Berthold C on: 01/02/2016 10:13 AM   Modules accepted: Orders

## 2016-01-09 ENCOUNTER — Telehealth: Payer: Self-pay | Admitting: *Deleted

## 2016-01-09 NOTE — Telephone Encounter (Signed)
-----   Message from Worthy Rancher, MD sent at 01/08/2016  9:33 AM EDT ----- Yes looks fine, put it under diagnosis hyperlipidemia ----- Message -----    From: Thana Ates, LPN    Sent: [id number]  11:04 AM      To: Fransisca Kaufmann Dettinger, MD  Please review for treadmill

## 2016-01-09 NOTE — Telephone Encounter (Addendum)
Appointment scheduled on 8/10 at 8:00am for a treadmill. Instructions mailed to patient.

## 2016-01-09 NOTE — Telephone Encounter (Signed)
LMTCB to schedule treadmill.  

## 2016-01-15 ENCOUNTER — Other Ambulatory Visit: Payer: Self-pay | Admitting: Family Medicine

## 2016-01-27 ENCOUNTER — Other Ambulatory Visit: Payer: 59

## 2016-01-27 DIAGNOSIS — Z0000 Encounter for general adult medical examination without abnormal findings: Secondary | ICD-10-CM

## 2016-01-27 DIAGNOSIS — Z1211 Encounter for screening for malignant neoplasm of colon: Secondary | ICD-10-CM

## 2016-01-29 LAB — FECAL OCCULT BLOOD, IMMUNOCHEMICAL: FECAL OCCULT BLD: NEGATIVE

## 2016-03-02 ENCOUNTER — Encounter: Payer: Self-pay | Admitting: Family Medicine

## 2016-03-02 ENCOUNTER — Other Ambulatory Visit: Payer: Self-pay | Admitting: Family Medicine

## 2016-03-02 ENCOUNTER — Ambulatory Visit (INDEPENDENT_AMBULATORY_CARE_PROVIDER_SITE_OTHER): Payer: 59 | Admitting: Family Medicine

## 2016-03-02 VITALS — BP 126/87 | HR 77 | Temp 97.8°F | Ht 66.0 in | Wt 176.0 lb

## 2016-03-02 DIAGNOSIS — R5381 Other malaise: Secondary | ICD-10-CM

## 2016-03-02 DIAGNOSIS — W57XXXA Bitten or stung by nonvenomous insect and other nonvenomous arthropods, initial encounter: Secondary | ICD-10-CM

## 2016-03-02 DIAGNOSIS — J029 Acute pharyngitis, unspecified: Secondary | ICD-10-CM

## 2016-03-02 DIAGNOSIS — T148 Other injury of unspecified body region: Secondary | ICD-10-CM | POA: Diagnosis not present

## 2016-03-02 DIAGNOSIS — B349 Viral infection, unspecified: Secondary | ICD-10-CM

## 2016-03-02 DIAGNOSIS — K120 Recurrent oral aphthae: Secondary | ICD-10-CM | POA: Diagnosis not present

## 2016-03-02 MED ORDER — AMOXICILLIN 500 MG PO CAPS: 500.0000 mg | ORAL_CAPSULE | Freq: Three times a day (TID) | ORAL | Status: DC

## 2016-03-02 NOTE — Progress Notes (Signed)
Subjective:    Patient ID: Chris Ali, male    DOB: 18-Feb-1961, 55 y.o.   MRN: [id number]  HPI Patient here today for several recent tick bites and recent sire throat with swelling and fever.He has had fever off and on for 5 days. The throat has been swelling and he has a burning sensation in his mouth. This started about 3 weeks ago. He also indicates he has had several tick bites this year. The patient denies chest congestion or purulent sputum. He has no GI symptoms and no rash. He is just been feeling bad for 2-3 weeks. The last tick bites that he supposedly had were in April and May and there was a mixture of deer ticks and regular would tics.     Patient Active Problem List   Diagnosis Date Noted  . Allergic rhinitis 12/21/2013  . Hyperlipidemia 12/21/2013  . Nephrolithiasis 12/21/2013  . Vitamin D deficiency 12/21/2013  . Hiatal hernia 12/21/2013  . Tinnitus of right ear 02/03/2013   Outpatient Encounter Prescriptions as of 03/02/2016  Medication Sig  . atorvastatin (LIPITOR) 20 MG tablet TAKE 1 TABLET DAILY  . cetirizine (ZYRTEC) 10 MG tablet Take 10 mg by mouth at bedtime.  . Vitamin D, Ergocalciferol, (DRISDOL) 50000 units CAPS capsule Take 1 capsule (50,000 Units total) by mouth every 7 (seven) days.   No facility-administered encounter medications on file as of 03/02/2016.      Review of Systems  Constitutional: Positive for fever.  HENT: Positive for sore throat (swelling).   Eyes: Negative.   Respiratory: Negative.   Cardiovascular: Negative.   Gastrointestinal: Negative.   Endocrine: Negative.   Genitourinary: Negative.   Musculoskeletal: Negative.   Skin: Negative.        Several tick bites  Allergic/Immunologic: Negative.   Neurological: Negative.   Hematological: Negative.   Psychiatric/Behavioral: Negative.        Objective:   Physical Exam  Constitutional: He is oriented to person, place, and time. He appears well-developed and well-nourished.  No distress.  HENT:  Head: Normocephalic and atraumatic.  Right Ear: External ear normal.  Left Ear: External ear normal.  Nose: Nose normal.  Mouth/Throat: No oropharyngeal exudate.  Aphthous ulcer and slightly red throat posteriorly  Eyes: Conjunctivae and EOM are normal. Pupils are equal, round, and reactive to light. Right eye exhibits no discharge. Left eye exhibits no discharge. No scleral icterus.  Neck: Normal range of motion. Neck supple. No thyromegaly present.  No anterior posterior nodes  Cardiovascular: Normal rate, regular rhythm and normal heart sounds.   No murmur heard. Pulmonary/Chest: Effort normal and breath sounds normal. No respiratory distress. He has no wheezes. He has no rales. He exhibits no tenderness.  No axillary adenopathy Dry cough  Abdominal: Soft. Bowel sounds are normal. He exhibits no mass. There is no tenderness. There is no rebound and no guarding.  No tenderness or organ enlargement  Musculoskeletal: Normal range of motion. He exhibits no edema.  Lymphadenopathy:    He has no cervical adenopathy.  Neurological: He is alert and oriented to person, place, and time.  Skin: Skin is warm and dry. No rash noted.  Psychiatric: He has a normal mood and affect. His behavior is normal. Judgment and thought content normal.  Nursing note and vitals reviewed.   BP 126/87 mmHg  Pulse 77  Temp(Src) 97.8 F (36.6 C) (Oral)  Ht 5\' 6"  (1.676 m)  Wt 176 lb (79.833 kg)  BMI 28.42 kg/m2  Assessment & Plan:  1. Sore throat -Drink plenty of fluids and stay well hydrated - Culture, Group A Strep - Rapid strep screen (not at [facility]) - amoxicillin (AMOXIL) 500 MG capsule; Take 1 capsule (500 mg total) by mouth 3 (three) times daily.  Dispense: 30 capsule; Refill: 0  2. Tick bite -The patient was reminded check himself regularly for tick bites and use deep [place] off prior to exposure to ticks - CBC with Differential/Platelet - Lyme Ab/Western Blot  Reflex - [place] mtn spotted fvr abs pnl(IgG+IgM)  3. Malaise -Drink plenty of fluids take Tylenol for aches pains and fever  4. Viral syndrome -Drink plenty of fluids and take Tylenol for aches pains and fever  5. Aphthous ulcer -Reassure  Patient Instructions  The patient should take the antibiotic as directed and until completed We'll call with lab work results as soon as they become available He should drink plenty of fluids and stay well hydrated He should use deep [place] all and all measures to prevent tick bites Because of what appears to be alone*tick bite he should be reminded that he may develop an intolerance to beef and pork products Take Mucinex as needed for cough and congestion and drink plenty of fluids as mentioned above   Arrie Senate MD

## 2016-03-02 NOTE — Patient Instructions (Signed)
The patient should take the antibiotic as directed and until completed We'll call with lab work results as soon as they become available He should drink plenty of fluids and stay well hydrated He should use deep [place] all and all measures to prevent tick bites Because of what appears to be alone*tick bite he should be reminded that he may develop an intolerance to beef and pork products Take Mucinex as needed for cough and congestion and drink plenty of fluids as mentioned above

## 2016-03-03 LAB — CBC WITH DIFFERENTIAL/PLATELET
BASOS: 0 %
Basophils Absolute: 0 10*3/uL (ref 0.0–0.2)
EOS (ABSOLUTE): 0.2 10*3/uL (ref 0.0–0.4)
EOS: 4 %
HEMATOCRIT: 45.3 % (ref 37.5–51.0)
HEMOGLOBIN: 15.4 g/dL (ref 12.6–17.7)
IMMATURE GRANS (ABS): 0 10*3/uL (ref 0.0–0.1)
IMMATURE GRANULOCYTES: 0 %
LYMPHS: 22 %
Lymphocytes Absolute: 1.2 10*3/uL (ref 0.7–3.1)
MCH: 30.7 pg (ref 26.6–33.0)
MCHC: 34 g/dL (ref 31.5–35.7)
MCV: 90 fL (ref 79–97)
MONOCYTES: 7 %
Monocytes Absolute: 0.4 10*3/uL (ref 0.1–0.9)
NEUTROS ABS: 3.7 10*3/uL (ref 1.4–7.0)
NEUTROS PCT: 67 %
PLATELETS: 211 10*3/uL (ref 150–379)
RBC: 5.01 x10E6/uL (ref 4.14–5.80)
RDW: 13.9 % (ref 12.3–15.4)
WBC: 5.5 10*3/uL (ref 3.4–10.8)

## 2016-03-03 LAB — ROCKY MTN SPOTTED FVR ABS PNL(IGG+IGM)
RMSF IGG: NEGATIVE
RMSF IgM: 0.26 index (ref 0.00–0.89)

## 2016-03-03 LAB — LYME AB/WESTERN BLOT REFLEX
LYME DISEASE AB, QUANT, IGM: <0.8 index (ref 0.00–0.79)
Lyme IgG/IgM Ab: <0.91 {ISR} (ref 0.00–0.90)

## 2016-03-05 LAB — CULTURE, GROUP A STREP: STREP A CULTURE: NEGATIVE

## 2016-03-05 LAB — RAPID STREP SCREEN (MED CTR MEBANE ONLY): Strep Gp A Ag, IA W/Reflex: NEGATIVE

## 2016-03-26 ENCOUNTER — Ambulatory Visit (INDEPENDENT_AMBULATORY_CARE_PROVIDER_SITE_OTHER): Payer: 59

## 2016-03-26 DIAGNOSIS — Z8249 Family history of ischemic heart disease and other diseases of the circulatory system: Secondary | ICD-10-CM | POA: Diagnosis not present

## 2016-03-26 DIAGNOSIS — E785 Hyperlipidemia, unspecified: Secondary | ICD-10-CM | POA: Diagnosis not present

## 2016-03-27 LAB — EXERCISE TOLERANCE TEST
CSEPED: 9 min
CSEPEDS: 52 s
CSEPHR: 91 %
CSEPPHR: 153 {beats}/min
Estimated workload: 10.7 METS
MPHR: 168 {beats}/min
RPE: 7
Rest HR: 79 {beats}/min

## 2016-04-01 ENCOUNTER — Ambulatory Visit (INDEPENDENT_AMBULATORY_CARE_PROVIDER_SITE_OTHER): Payer: 59 | Admitting: Family Medicine

## 2016-04-01 ENCOUNTER — Other Ambulatory Visit: Payer: Self-pay | Admitting: Family Medicine

## 2016-04-01 ENCOUNTER — Encounter: Payer: Self-pay | Admitting: Family Medicine

## 2016-04-01 VITALS — BP 111/74 | HR 75 | Temp 97.3°F | Ht 66.0 in | Wt 175.4 lb

## 2016-04-01 DIAGNOSIS — B370 Candidal stomatitis: Secondary | ICD-10-CM

## 2016-04-01 DIAGNOSIS — M791 Myalgia, unspecified site: Secondary | ICD-10-CM

## 2016-04-01 DIAGNOSIS — R5381 Other malaise: Secondary | ICD-10-CM

## 2016-04-01 DIAGNOSIS — L239 Allergic contact dermatitis, unspecified cause: Secondary | ICD-10-CM

## 2016-04-01 DIAGNOSIS — L200 Besnier's prurigo: Secondary | ICD-10-CM

## 2016-04-01 MED ORDER — CLOTRIMAZOLE 10 MG MT TROC: 10.0000 mg | Freq: Every day | OROMUCOSAL | 2 refills | Status: DC

## 2016-04-01 MED ORDER — BETAMETHASONE SOD PHOS & ACET 6 (3-3) MG/ML IJ SUSP
6.0000 mg | Freq: Once | INTRAMUSCULAR | Status: AC
  Administered 2016-04-01: 6 mg via INTRAMUSCULAR

## 2016-04-01 NOTE — Progress Notes (Signed)
Subjective:  Patient ID: Chris Ali, male    DOB: [birth year]  Age: 55 y.o. MRN: [id number]  CC: Rash (bilateral arms and lower torso) and Thrush   HPI Chris Ali presents for Rash present for couple months. It did not respond to the amoxicillin given before. Patient states that it can be moderately pruritic on occasion. It consists of red bumps that come up over the abdomen and down into the groin. The forearms are involved but not the hands. Chris Ali's noticed them on the lower legs as well from the knee and behind the knee down to the ankle. Chris Ali is concerned that his mother has had an autoimmune condition that has been with her for 3 years. Chris Ali supposes that his symptoms may be from a similar cause. Additionally there is some tenderness and burning of the tongue particularly with hot liquids such as coffee ongoing for several weeks. It's been more of a gray film most of the time just a little bit less today than usual.   History Chris Ali has a past medical history of Allergy; Hyperlipidemia; Neck pain; and Tinnitus.   Chris Ali has a past surgical history that includes Open repair periarticular fracture / dislocation elbow (Left, 2004).   His family history includes Cancer in his father, paternal grandfather, and sister; Heart attack (age of onset: 18) in his maternal grandmother; Heart disease in his maternal grandfather and maternal grandmother; Hypertension in his mother; Pancreatic cancer in his paternal grandfather.Chris Ali reports that Chris Ali quit smoking about 28 years ago. His smoking use included Cigarettes. Chris Ali quit smokeless tobacco use about 27 years ago. Chris Ali reports that Chris Ali drinks alcohol. Chris Ali reports that Chris Ali does not use drugs.    ROS Review of Systems  Constitutional: Positive for fatigue. Negative for chills, diaphoresis and fever.  HENT: Negative for rhinorrhea and sore throat.   Respiratory: Negative for cough and shortness of breath.   Cardiovascular: Negative for chest pain.    Gastrointestinal: Negative for abdominal pain.  Musculoskeletal: Negative for arthralgias and myalgias.  Skin: Positive for color change and rash. Negative for pallor.  Neurological: Negative for weakness and headaches.    Objective:  BP 111/74 (BP Location: Left Arm, Patient Position: Sitting, Cuff Size: Normal)   Pulse 75   Temp 97.3 F (36.3 C) (Oral)   Ht _0  (1.676 m)   Wt 175 lb 6.4 oz (79.6 kg)   SpO2 97%   BMI 28.31 kg/m   BP Readings from Last 3 Encounters:  04/01/16 111/74  03/02/16 126/87  01/01/16 117/76    Wt Readings from Last 3 Encounters:  04/01/16 175 lb 6.4 oz (79.6 kg)  03/02/16 176 lb (79.8 kg)  01/01/16 169 lb (76.7 kg)     Physical Exam  Constitutional: Chris Ali is oriented to person, place, and time. Chris Ali appears well-developed and well-nourished. No distress.  HENT:  Head: Normocephalic and atraumatic.  Right Ear: External ear normal.  Left Ear: External ear normal.  Nose: Nose normal.  Mouth/Throat: Oropharynx is clear and moist.  Eyes: Conjunctivae and EOM are normal. Pupils are equal, round, and reactive to light.  Neck: Normal range of motion. Neck supple. No thyromegaly present.  Cardiovascular: Normal rate, regular rhythm and normal heart sounds.   No murmur heard. Pulmonary/Chest: Effort normal and breath sounds normal. No respiratory distress. Chris Ali has no wheezes. Chris Ali has no rales.  Abdominal: Soft. Bowel sounds are normal. There is no tenderness.  Lymphadenopathy:    Chris Ali has no cervical adenopathy.  Neurological: Chris Ali is alert and oriented to person, place, and time. Chris Ali has normal reflexes.  Skin: Skin is warm and dry.  Scattered papular eruption over her forearms but not onto the hands or in web spaces. Over lower legs but not into feet or toes. It is noted scattered scantly across the abdomen and into the groin. Each of these papules are erythematous generally have been excoriated and measure 2-4 mm each. No definite burrows. Face and scalp  have been spared.  Psychiatric: Chris Ali has a normal mood and affect. His behavior is normal. Judgment and thought content normal.     Lab Results  Component Value Date   WBC 5.5 03/02/2016   HGB 15.5 12/28/2014   HCT 45.3 03/02/2016   PLT 211 03/02/2016   GLUCOSE 87 01/01/2016   CHOL 140 01/01/2016   TRIG 115 01/01/2016   HDL 52 01/01/2016   LDLCALC 135 (H) 12/15/2013   ALT 44 01/01/2016   AST 29 01/01/2016   NA 143 01/01/2016   K 4.3 01/01/2016   CL 103 01/01/2016   CREATININE 0.95 01/01/2016   BUN 15 01/01/2016   CO2 24 01/01/2016   PSA 0.7 12/15/2013    No results found.  Assessment & Plan:   Chris Ali was seen today for rash and thrush.  Diagnoses and all orders for this visit:  Thrush -     CBC with Differential/Platelet -     CMP14+EGFR  Allergic dermatitis -     betamethasone acetate-betamethasone sodium phosphate (CELESTONE) injection 6 mg; Inject 1 mL (6 mg total) into the muscle once. -     CBC with Differential/Platelet -     CMP14+EGFR  Malaise -     betamethasone acetate-betamethasone sodium phosphate (CELESTONE) injection 6 mg; Inject 1 mL (6 mg total) into the muscle once. -     CBC with Differential/Platelet -     CMP14+EGFR -     Anti-DNA antibody, double-stranded -     Sedimentation rate -     High sensitivity CRP -     ANA w/Reflex if Positive  Other orders -     clotrimazole (MYCELEX) 10 MG troche; Take 1 tablet (10 mg total) by mouth 5 (five) times daily. For yeast in mouth and throat      I have discontinued Chris Ali amoxicillin. I am also having him start on clotrimazole. Additionally, I am having him maintain his cetirizine, Vitamin D (Ergocalciferol), and atorvastatin. We will continue to administer betamethasone acetate-betamethasone sodium phosphate.  Meds ordered this encounter  Medications  . clotrimazole (MYCELEX) 10 MG troche    Sig: Take 1 tablet (10 mg total) by mouth 5 (five) times daily. For yeast in mouth and throat     Dispense:  35 tablet    Refill:  2  . betamethasone acetate-betamethasone sodium phosphate (CELESTONE) injection 6 mg     Follow-up: Return if symptoms worsen or fail to improve.  Claretta Fraise, M.D.

## 2016-04-03 ENCOUNTER — Other Ambulatory Visit: Payer: 59

## 2016-04-03 LAB — CBC WITH DIFFERENTIAL/PLATELET
BASOS ABS: 0 10*3/uL (ref 0.0–0.2)
Basos: 0 %
EOS (ABSOLUTE): 0.2 10*3/uL (ref 0.0–0.4)
Eos: 3 %
HEMOGLOBIN: 15.5 g/dL (ref 12.6–17.7)
Hematocrit: 43.4 % (ref 37.5–51.0)
Immature Grans (Abs): 0 10*3/uL (ref 0.0–0.1)
Immature Granulocytes: 0 %
LYMPHS ABS: 1.7 10*3/uL (ref 0.7–3.1)
Lymphs: 22 %
MCH: 31.3 pg (ref 26.6–33.0)
MCHC: 35.7 g/dL (ref 31.5–35.7)
MCV: 88 fL (ref 79–97)
MONOCYTES: 8 %
Monocytes Absolute: 0.6 10*3/uL (ref 0.1–0.9)
NEUTROS PCT: 67 %
Neutrophils Absolute: 5 10*3/uL (ref 1.4–7.0)
Platelets: 239 10*3/uL (ref 150–379)
RBC: 4.96 x10E6/uL (ref 4.14–5.80)
RDW: 13.9 % (ref 12.3–15.4)
WBC: 7.5 10*3/uL (ref 3.4–10.8)

## 2016-04-03 LAB — CMP14+EGFR
ALBUMIN: 4.5 g/dL (ref 3.5–5.5)
ALK PHOS: 71 IU/L (ref 39–117)
ALT: 40 IU/L (ref 0–44)
AST: 25 IU/L (ref 0–40)
Albumin/Globulin Ratio: 1.5 (ref 1.2–2.2)
BUN/Creatinine Ratio: 16 (ref 9–20)
BUN: 17 mg/dL (ref 6–24)
Bilirubin Total: 0.5 mg/dL (ref 0.0–1.2)
CHLORIDE: 100 mmol/L (ref 96–106)
CO2: 22 mmol/L (ref 18–29)
CREATININE: 1.08 mg/dL (ref 0.76–1.27)
Calcium: 9.9 mg/dL (ref 8.7–10.2)
GFR calc Af Amer: 91 mL/min/{1.73_m2} (ref >59)
GFR calc non Af Amer: 78 mL/min/{1.73_m2} (ref >59)
GLUCOSE: 96 mg/dL (ref 65–99)
Globulin, Total: 3 g/dL (ref 1.5–4.5)
Potassium: 4.2 mmol/L (ref 3.5–5.2)
Sodium: 141 mmol/L (ref 134–144)
Total Protein: 7.5 g/dL (ref 6.0–8.5)

## 2016-04-03 LAB — ANTI-DNA ANTIBODY, DOUBLE-STRANDED: DSDNA AB: 1 [IU]/mL (ref 0–9)

## 2016-04-03 LAB — ANA W/REFLEX IF POSITIVE: Anti Nuclear Antibody(ANA): NEGATIVE

## 2016-04-03 LAB — SEDIMENTATION RATE

## 2016-04-03 LAB — HIGH SENSITIVITY CRP: CRP, High Sensitivity: 5.24 mg/L — ABNORMAL HIGH (ref 0.00–3.00)

## 2016-04-03 NOTE — Addendum Note (Signed)
Addended by: Rolena Infante on: 04/03/2016 11:00 AM   Modules accepted: Orders

## 2016-04-04 LAB — SEDIMENTATION RATE: SED RATE: 9 mm/h (ref 0–30)

## 2016-04-07 ENCOUNTER — Ambulatory Visit (INDEPENDENT_AMBULATORY_CARE_PROVIDER_SITE_OTHER): Payer: 59 | Admitting: Family Medicine

## 2016-04-07 ENCOUNTER — Encounter: Payer: Self-pay | Admitting: Family Medicine

## 2016-04-07 VITALS — BP 123/77 | HR 74 | Temp 97.9°F | Ht 66.0 in | Wt 174.0 lb

## 2016-04-07 DIAGNOSIS — R5383 Other fatigue: Secondary | ICD-10-CM

## 2016-04-07 DIAGNOSIS — R5381 Other malaise: Secondary | ICD-10-CM

## 2016-04-07 MED ORDER — DOXYCYCLINE HYCLATE 100 MG PO TABS: 100.0000 mg | ORAL_TABLET | Freq: Two times a day (BID) | ORAL | 0 refills | Status: DC

## 2016-04-07 NOTE — Progress Notes (Signed)
   Subjective:    Patient ID: Chris Ali, male    DOB: 01-27-1961, 55 y.o.   MRN: [id number]  HPI Patient here today for follow up on rash, SOB, and fatigue. These symptoms first started around 03/01/16. He is accompanied today by his wife.  Patient has undergone recent testing for tick borne illnesses as well as connective tissue disease CBC sedimentation rate C-reactive protein all of which is mostly normal. Symptoms persist including myalgias week fatigue some fever and congestion. This is his third office visit for these same symptoms. He has had a history of multiple tick bites.     Patient Active Problem List   Diagnosis Date Noted  . Allergic rhinitis 12/21/2013  . Hyperlipidemia 12/21/2013  . Nephrolithiasis 12/21/2013  . Vitamin D deficiency 12/21/2013  . Hiatal hernia 12/21/2013  . Tinnitus of right ear 02/03/2013   Outpatient Encounter Prescriptions as of 04/07/2016  Medication Sig  . atorvastatin (LIPITOR) 20 MG tablet TAKE 1 TABLET DAILY  . cetirizine (ZYRTEC) 10 MG tablet Take 10 mg by mouth at bedtime.  . clotrimazole (MYCELEX) 10 MG troche Take 1 tablet (10 mg total) by mouth 5 (five) times daily. For yeast in mouth and throat  . Vitamin D, Ergocalciferol, (DRISDOL) 50000 units CAPS capsule Take 1 capsule (50,000 Units total) by mouth every 7 (seven) days.   No facility-administered encounter medications on file as of 04/07/2016.      Review of Systems  Constitutional: Positive for fatigue.  HENT: Negative.   Eyes: Negative.   Respiratory: Positive for shortness of breath.   Cardiovascular: Negative.   Gastrointestinal: Negative.   Endocrine: Negative.   Genitourinary: Negative.   Musculoskeletal: Positive for myalgias (left arm and right knee).  Skin: Positive for rash.  Allergic/Immunologic: Negative.   Neurological: Positive for weakness.  Hematological: Negative.   Psychiatric/Behavioral: Negative.        Objective:   Physical Exam    Constitutional: He is oriented to person, place, and time. He appears well-developed and well-nourished.  HENT:  Mouth/Throat: Oropharyngeal exudate present.  Cardiovascular: Normal rate, regular rhythm and normal heart sounds.   Pulmonary/Chest: Effort normal and breath sounds normal.  Abdominal: Soft. He exhibits no mass. There is no tenderness. There is no guarding.  Neurological: He is alert and oriented to person, place, and time.  Skin: Skin is warm. No rash noted.  Psychiatric: He has a normal mood and affect. His behavior is normal.   BP 123/77 (BP Location: Right Arm)   Pulse 74   Temp 97.9 F (36.6 C) (Oral)   Ht 5\' 6"  (1.676 m)   Wt 174 lb (78.9 kg)   BMI 28.08 kg/m         Assessment & Plan:  1. Malaise and fatigue This being the third visit for the same symptoms with tick exposures that and despite multiple negative serologies I think it might be worth a try to treat him. I explained to patient and wife is serologies or not always 100% sensitive and with exposure and flulike illness it would be worth a try, a trial of treatment. If this is a tick borne illness there may be some dramatic response within the first few days of treatment. If symptoms continue to persist consider further workup or referral to specialty  Wardell Honour MD

## 2016-04-10 ENCOUNTER — Telehealth: Payer: Self-pay | Admitting: Family Medicine

## 2016-05-08 ENCOUNTER — Encounter: Payer: Self-pay | Admitting: Family Medicine

## 2016-05-08 ENCOUNTER — Ambulatory Visit (INDEPENDENT_AMBULATORY_CARE_PROVIDER_SITE_OTHER): Payer: 59 | Admitting: Family Medicine

## 2016-05-08 VITALS — BP 123/67 | HR 69 | Temp 97.9°F | Ht 66.0 in | Wt 180.0 lb

## 2016-05-08 DIAGNOSIS — S3092XA Unspecified superficial injury of abdominal wall, initial encounter: Secondary | ICD-10-CM | POA: Diagnosis not present

## 2016-05-08 DIAGNOSIS — W57XXXA Bitten or stung by nonvenomous insect and other nonvenomous arthropods, initial encounter: Secondary | ICD-10-CM

## 2016-05-08 DIAGNOSIS — B370 Candidal stomatitis: Secondary | ICD-10-CM | POA: Diagnosis not present

## 2016-05-08 MED ORDER — CLOTRIMAZOLE 10 MG MT TROC: 10.0000 mg | Freq: Every day | OROMUCOSAL | 2 refills | Status: DC

## 2016-05-08 MED ORDER — DOXYCYCLINE HYCLATE 100 MG PO CAPS: 100.0000 mg | ORAL_CAPSULE | Freq: Two times a day (BID) | ORAL | 0 refills | Status: DC

## 2016-05-08 NOTE — Progress Notes (Signed)
Subjective:  Patient ID: Chris Ali, male    DOB: 1961/03/29  Age: 55 y.o. MRN: [id number]  CC: Tick Removal   HPI Hargis A Kulak presents for Found two deer ticks on his waistline 4 days ago. Removed thumb. They were not engorged. However he had a symptomatic infection last month that resembled Lyme's disease. He took doxycycline and was better within a couple days. He was to get ahead of at this time and go ahead with doxycycline if at all possible. He is concerned that he was seronegative last time even though he responded quickly to the treatment. Currently he is asymptomatic. He has half a dozen papules spread over his body 2 on the back and 2 on the left leg. 2 on the abdomen. He is working outside a lot building a pond. A lot of tick exposure noted.   History Zyheem has a past medical history of Allergy; Hyperlipidemia; Neck pain; and Tinnitus.   He has a past surgical history that includes Open repair periarticular fracture / dislocation elbow (Left, 2004).   His family history includes Cancer in his father, paternal grandfather, and sister; Heart attack (age of onset: 64) in his maternal grandmother; Heart disease in his maternal grandfather and maternal grandmother; Hypertension in his mother; Pancreatic cancer in his paternal grandfather.He reports that he quit smoking about 28 years ago. His smoking use included Cigarettes. He quit smokeless tobacco use about 27 years ago. He reports that he drinks alcohol. He reports that he does not use drugs.    ROS Review of Systems  Constitutional: Negative for chills, diaphoresis and fever.  HENT: Negative for rhinorrhea and sore throat.   Respiratory: Negative for cough and shortness of breath.   Cardiovascular: Negative for chest pain.  Gastrointestinal: Negative for abdominal pain.  Musculoskeletal: Negative for arthralgias and myalgias.  Skin: Positive for rash.  Neurological: Negative for weakness and headaches.     Objective:  BP 123/67   Pulse 69   Temp 97.9 F (36.6 C) (Oral)   Ht 5\' 6"  (1.676 m)   Wt 180 lb (81.6 kg)   BMI 29.05 kg/m   BP Readings from Last 3 Encounters:  05/08/16 123/67  04/07/16 123/77  04/01/16 111/74    Wt Readings from Last 3 Encounters:  05/08/16 180 lb (81.6 kg)  04/07/16 174 lb (78.9 kg)  04/01/16 175 lb 6.4 oz (79.6 kg)     Physical Exam  Constitutional: He is oriented to person, place, and time. He appears well-developed and well-nourished.  HENT:  Head: Normocephalic and atraumatic.  Right Ear: External ear normal.  Left Ear: External ear normal.  Mouth/Throat: No oropharyngeal exudate or posterior oropharyngeal erythema.  Minimal plaque on tongue   Eyes: Pupils are equal, round, and reactive to light.  Neck: Normal range of motion. Neck supple.  Cardiovascular: Normal rate and regular rhythm.   No murmur heard. Pulmonary/Chest: Breath sounds normal. No respiratory distress.  Neurological: He is alert and oriented to person, place, and time.  Skin: Skin is warm and dry.  6 erythematous papules, two at waist, two LLE, shin & two on lower right back. No erythema marginatum.   Vitals reviewed.    Lab Results  Component Value Date   WBC 7.5 04/01/2016   HGB 15.5 12/28/2014   HCT 43.4 04/01/2016   PLT 239 04/01/2016   GLUCOSE 96 04/01/2016   CHOL 140 01/01/2016   TRIG 115 01/01/2016   HDL 52 01/01/2016   LDLCALC 135 (  H) 12/15/2013   ALT 40 04/01/2016   AST 25 04/01/2016   NA 141 04/01/2016   K 4.2 04/01/2016   CL 100 04/01/2016   CREATININE 1.08 04/01/2016   BUN 17 04/01/2016   CO2 22 04/01/2016   PSA 0.7 12/15/2013    No results found.  Assessment & Plan:   Shreyas was seen today for tick removal.  Diagnoses and all orders for this visit:  Tick bite -     Lyme Ab/Western Blot Reflex  Thrush  Other orders -     doxycycline (VIBRAMYCIN) 100 MG capsule; Take 1 capsule (100 mg total) by mouth 2 (two) times daily. -      clotrimazole (MYCELEX) 10 MG troche; Take 1 tablet (10 mg total) by mouth 5 (five) times daily. For yeast in mouth and throat      I have discontinued Mr. Burtenshaw doxycycline. I am also having him start on doxycycline. Additionally, I am having him maintain his cetirizine, Vitamin D (Ergocalciferol), atorvastatin, and clotrimazole.  Meds ordered this encounter  Medications  . doxycycline (VIBRAMYCIN) 100 MG capsule    Sig: Take 1 capsule (100 mg total) by mouth 2 (two) times daily.    Dispense:  20 capsule    Refill:  0  . clotrimazole (MYCELEX) 10 MG troche    Sig: Take 1 tablet (10 mg total) by mouth 5 (five) times daily. For yeast in mouth and throat    Dispense:  35 tablet    Refill:  2     Follow-up: Return if symptoms worsen or fail to improve.  Claretta Fraise, M.D.

## 2016-05-11 LAB — LYME AB/WESTERN BLOT REFLEX: Lyme IgG/IgM Ab: <0.91 {ISR} (ref 0.00–0.90)

## 2017-01-01 ENCOUNTER — Ambulatory Visit (INDEPENDENT_AMBULATORY_CARE_PROVIDER_SITE_OTHER): Payer: 59 | Admitting: Family Medicine

## 2017-01-01 ENCOUNTER — Encounter: Payer: Self-pay | Admitting: Family Medicine

## 2017-01-01 ENCOUNTER — Ambulatory Visit (INDEPENDENT_AMBULATORY_CARE_PROVIDER_SITE_OTHER): Payer: 59

## 2017-01-01 VITALS — BP 119/77 | HR 64 | Temp 97.7°F | Ht 66.0 in | Wt 177.0 lb

## 2017-01-01 DIAGNOSIS — M542 Cervicalgia: Secondary | ICD-10-CM

## 2017-01-01 DIAGNOSIS — E559 Vitamin D deficiency, unspecified: Secondary | ICD-10-CM

## 2017-01-01 DIAGNOSIS — N400 Enlarged prostate without lower urinary tract symptoms: Secondary | ICD-10-CM

## 2017-01-01 DIAGNOSIS — R200 Anesthesia of skin: Secondary | ICD-10-CM

## 2017-01-01 DIAGNOSIS — Z0000 Encounter for general adult medical examination without abnormal findings: Secondary | ICD-10-CM

## 2017-01-01 DIAGNOSIS — E7800 Pure hypercholesterolemia, unspecified: Secondary | ICD-10-CM

## 2017-01-01 LAB — MICROSCOPIC EXAMINATION
BACTERIA UA: NONE SEEN
Epithelial Cells (non renal): NONE SEEN /hpf (ref 0–10)
RBC MICROSCOPIC, UA: NONE SEEN /HPF (ref 0–?)
Renal Epithel, UA: NONE SEEN /hpf
WBC UA: NONE SEEN /HPF (ref 0–?)

## 2017-01-01 LAB — URINALYSIS, COMPLETE
BILIRUBIN UA: NEGATIVE
GLUCOSE, UA: NEGATIVE
KETONES UA: NEGATIVE
Leukocytes, UA: NEGATIVE
NITRITE UA: NEGATIVE
RBC, UA: NEGATIVE
Specific Gravity, UA: 1.02 (ref 1.005–1.030)
UUROB: 1 mg/dL (ref 0.2–1.0)
pH, UA: 7 (ref 5.0–7.5)

## 2017-01-01 NOTE — Patient Instructions (Addendum)
Continue current medications. Continue good therapeutic lifestyle changes which include good diet and exercise. Fall precautions discussed with patient. If an FOBT was given today- please return it to our front desk. If you are over 54 years old - you may need Prevnar 89 or the adult Pneumonia vaccine.  **Flu shots are available--- please call and schedule a FLU-CLINIC appointment**  After your visit with [place] today you will receive a survey in the mail or online from [employer] regarding your care with [place]. Please take a moment to fill this out. Your feedback is very important to [place] as you can help [place] better understand your patient needs as well as improve your experience and satisfaction. WE CARE ABOUT YOU!!!   Because of the ongoing neck pain we will arrange for you to have an MRI of your cervical spine Get the results of that we will make a decision if you need to see someone further about this. Remembered that ibuprofen can irritate the stomach and so can't Aleve but Aleve is probably safer for your blood pressure. Always take NSAIDs after eating. Use soaps fabric softeners and detergents that are scent free Periodic use of some cortisone 10 behind the ears may help some of the dry skin dermatitis Use deep [place] off and layered protection when out in the woods Continue to follow-up aggressive therapeutic lifestyle changes

## 2017-01-01 NOTE — Addendum Note (Signed)
Addended by: Zannie Cove on: 01/01/2017 08:47 AM   Modules accepted: Orders

## 2017-01-01 NOTE — Addendum Note (Signed)
Addended by: Zannie Cove on: 01/01/2017 11:37 AM   Modules accepted: Orders

## 2017-01-01 NOTE — Addendum Note (Signed)
Addended by: Zannie Cove on: 01/01/2017 08:44 AM   Modules accepted: Orders

## 2017-01-01 NOTE — Progress Notes (Signed)
Subjective:    Patient ID: Chris Ali, male    DOB: February 14, 1961, 56 y.o.   MRN: [id number]  HPI  Patient is here today for annual wellness exam and follow up of chronic medical problems which includes hyperlipidemia. He is taking medication regularly.The patient is doing well overall. He does complain of some ongoing neck issues. He also complains of dry skin behind his years and a mouth irritation. He is due to get lab work today a urinalysis and a rectal exam. He is also due to chest x-ray but we will have to wait on this due to the machine being down. His initial vital signs are stable and his weight is down 3 pounds from the last visit. On reviewing his x-rays from May 2017 which was one year ago he did have normal alignment with very mild degenerative disc disease at C5 and 6 and C6 and 7. This will be reviewed with him during the visit and he'll be given a copy of this report. In August of the past year he also had a normal stress test. The patient has had neck pain for a good while but it seems to be getting worse and is affecting the right side of the shoulder more than the left and he does have some numbness in both arms especially at nighttime. This is always worse after looking up and doing work overhead. We discussed looking into this further and he is interesting in getting an MRI of the cervical spine. We will do this and call him with the results once they are returned. He he complains of a mouth irritation and dry skin behind the ears since he took antibiotics were tick bite in the past so we will look at this further during the exam today. He denies any chest pain or shortness of breath. He denies any trouble with nausea vomiting diarrhea blood in the stool or black tarry bowel movements. He had a colonoscopy in May 2015 and this was normal and was told he did not need another one for 10 years. He's passing his water without problems and there is no problems with his sex life. He gets his  eye exam done yearly.    Patient Active Problem List   Diagnosis Date Noted  . Allergic rhinitis 12/21/2013  . Hyperlipidemia 12/21/2013  . Nephrolithiasis 12/21/2013  . Vitamin D deficiency 12/21/2013  . Hiatal hernia 12/21/2013  . Tinnitus of right ear 02/03/2013   Outpatient Encounter Prescriptions as of 01/01/2017  Medication Sig  . atorvastatin (LIPITOR) 20 MG tablet TAKE 1 TABLET DAILY  . cetirizine (ZYRTEC) 10 MG tablet Take 10 mg by mouth at bedtime.  . [DISCONTINUED] Vitamin D, Ergocalciferol, (DRISDOL) 50000 units CAPS capsule Take 1 capsule (50,000 Units total) by mouth every 7 (seven) days.  . [DISCONTINUED] clotrimazole (MYCELEX) 10 MG troche Take 1 tablet (10 mg total) by mouth 5 (five) times daily. For yeast in mouth and throat  . [DISCONTINUED] doxycycline (VIBRAMYCIN) 100 MG capsule Take 1 capsule (100 mg total) by mouth 2 (two) times daily.   No facility-administered encounter medications on file as of 01/01/2017.         Review of Systems  Constitutional: Negative.   HENT: Negative.        Mouth irritation  Eyes: Negative.   Respiratory: Negative.   Cardiovascular: Negative.   Gastrointestinal: Negative.   Endocrine: Negative.   Genitourinary: Negative.   Musculoskeletal: Positive for neck stiffness.  Skin: Negative.  Dry skin behind ears  Allergic/Immunologic: Negative.   Neurological: Negative.   Hematological: Negative.   Psychiatric/Behavioral: Negative.       Objective:   Physical Exam  Constitutional: He is oriented to person, place, and time. He appears well-developed and well-nourished. No distress.  The patient is pleasant and alert  HENT:  Head: Normocephalic and atraumatic.  Right Ear: External ear normal.  Left Ear: External ear normal.  Mouth/Throat: Oropharynx is clear and moist. No oropharyngeal exudate.  Slight nasal congestion  Eyes: Conjunctivae and EOM are normal. Pupils are equal, round, and reactive to light. Right  eye exhibits no discharge. Left eye exhibits no discharge. No scleral icterus.  Neck: Normal range of motion. Neck supple. No thyromegaly present.  No bruits thyromegaly or anterior cervical adenopathy  Cardiovascular: Normal rate, regular rhythm, normal heart sounds and intact distal pulses.   No murmur heard. The heart is regular at 60/m  Pulmonary/Chest: Effort normal and breath sounds normal. No respiratory distress. He has no wheezes. He has no rales. He exhibits no tenderness.  Clear anteriorly and posteriorly no axillary adenopathy  Abdominal: Soft. Bowel sounds are normal. He exhibits no mass. There is no tenderness. There is no rebound and no guarding.  No abdominal tenderness masses bruits or organ enlargement and no inguinal adenopathy  Genitourinary: Rectum normal and penis normal.  Genitourinary Comments: The prostate is slightly enlarged but soft and smooth. There were no rectal masses. There were no inguinal hernias palpated. There were no testicular masses. External genitalia were within normal limits.  Musculoskeletal: Normal range of motion. He exhibits no edema or tenderness.  Lymphadenopathy:    He has no cervical adenopathy.  Neurological: He is alert and oriented to person, place, and time. He has normal reflexes. No cranial nerve deficit.  Skin: Skin is warm and dry. No rash noted. No erythema.  Dry skin  Psychiatric: He has a normal mood and affect. His behavior is normal. Judgment and thought content normal.  Nursing note and vitals reviewed.  BP 119/77 (BP Location: Left Arm)   Pulse 64   Temp 97.7 F (36.5 C) (Oral)   Ht 5' 6"  (1.676 m)   Wt 177 lb (80.3 kg)   BMI 28.57 kg/m         Assessment & Plan:  1. Pure hypercholesterolemia -Continue with atorvastatin and aggressive therapeutic lifestyle changes pending results of lab work - BMP8+EGFR - CBC with Differential/Platelet - Hepatic function panel - NMR, lipoprofile  2. Annual physical exam -The  patient is up-to-date on his colonoscopy. His family history is positive for heart disease prostate cancer and breast cancer in his sister and grandmother. -The patient has had an exercise treadmill test and this was within normal limits. - BMP8+EGFR - CBC with Differential/Platelet - Hepatic function panel - VITAMIN D 25 Hydroxy (Vit-D Deficiency, Fractures) - PSA, total and free - Thyroid Panel With TSH - NMR, lipoprofile - Urinalysis, Complete  3. Vitamin D deficiency -Continue vitamin D replacement pending results of lab work - CBC with Differential/Platelet - VITAMIN D 25 Hydroxy (Vit-D Deficiency, Fractures)  4. Benign prostatic hyperplasia, unspecified whether lower urinary tract symptoms present -The prostate remains slightly enlarged but with no lumps or masses and no symptoms with with this. - CBC with Differential/Platelet - PSA, total and free - NMR, lipoprofile  5. Neck pain -Because of the ongoing neck pain issues, the patient will be scheduled for an MRI to establish the underlying reason for his ongoing symptoms.  He has neuropathy to his right shoulder and numbness in both arms.  Patient Instructions  Continue current medications. Continue good therapeutic lifestyle changes which include good diet and exercise. Fall precautions discussed with patient. If an FOBT was given today- please return it to our front desk. If you are over 55 years old - you may need Prevnar 64 or the adult Pneumonia vaccine.  **Flu shots are available--- please call and schedule a FLU-CLINIC appointment**  After your visit with [place] today you will receive a survey in the mail or online from [employer] regarding your care with [place]. Please take a moment to fill this out. Your feedback is very important to [place] as you can help [place] better understand your patient needs as well as improve your experience and satisfaction. WE CARE ABOUT YOU!!!   Because of the ongoing neck pain we will arrange for you  to have an MRI of your cervical spine Get the results of that we will make a decision if you need to see someone further about this. Remembered that ibuprofen can irritate the stomach and so can't Aleve but Aleve is probably safer for your blood pressure. Always take NSAIDs after eating. Use soaps fabric softeners and detergents that are scent free Periodic use of some cortisone 10 behind the ears may help some of the dry skin dermatitis Use deep [place] off and layered protection when out in the woods Continue to follow-up aggressive therapeutic lifestyle changes  Arrie Senate MD

## 2017-01-02 LAB — HEPATIC FUNCTION PANEL
ALK PHOS: 57 IU/L (ref 39–117)
ALT: 30 IU/L (ref 0–44)
AST: 26 IU/L (ref 0–40)
Albumin: 4.6 g/dL (ref 3.5–5.5)
Bilirubin Total: 0.3 mg/dL (ref 0.0–1.2)
Bilirubin, Direct: 0.11 mg/dL (ref 0.00–0.40)
TOTAL PROTEIN: 7 g/dL (ref 6.0–8.5)

## 2017-01-02 LAB — CBC WITH DIFFERENTIAL/PLATELET
BASOS: 0 %
Basophils Absolute: 0 10*3/uL (ref 0.0–0.2)
EOS (ABSOLUTE): 0.1 10*3/uL (ref 0.0–0.4)
EOS: 1 %
HEMATOCRIT: 44.6 % (ref 37.5–51.0)
Hemoglobin: 15.2 g/dL (ref 13.0–17.7)
IMMATURE GRANS (ABS): 0 10*3/uL (ref 0.0–0.1)
IMMATURE GRANULOCYTES: 0 %
Lymphocytes Absolute: 1.3 10*3/uL (ref 0.7–3.1)
Lymphs: 27 %
MCH: 30 pg (ref 26.6–33.0)
MCHC: 34.1 g/dL (ref 31.5–35.7)
MCV: 88 fL (ref 79–97)
MONOS ABS: 0.4 10*3/uL (ref 0.1–0.9)
Monocytes: 7 %
NEUTROS ABS: 3.2 10*3/uL (ref 1.4–7.0)
NEUTROS PCT: 65 %
PLATELETS: 228 10*3/uL (ref 150–379)
RBC: 5.07 x10E6/uL (ref 4.14–5.80)
RDW: 13.8 % (ref 12.3–15.4)
WBC: 5 10*3/uL (ref 3.4–10.8)

## 2017-01-02 LAB — NMR, LIPOPROFILE
Cholesterol: 148 mg/dL (ref 100–199)
HDL Cholesterol by NMR: 47 mg/dL (ref >39)
HDL PARTICLE NUMBER: 35.9 umol/L (ref >=30.5)
LDL PARTICLE NUMBER: 985 nmol/L (ref <1000)
LDL Size: 20.9 nm (ref >20.5)
LDL-C: 83 mg/dL (ref 0–99)
LP-IR Score: 45 (ref <=45)
Small LDL Particle Number: 373 nmol/L (ref <=527)
TRIGLYCERIDES BY NMR: 91 mg/dL (ref 0–149)

## 2017-01-02 LAB — PSA, TOTAL AND FREE
PROSTATE SPECIFIC AG, SERUM: 0.8 ng/mL (ref 0.0–4.0)
PSA FREE PCT: 25 %
PSA, Free: 0.2 ng/mL

## 2017-01-02 LAB — BMP8+EGFR
BUN / CREAT RATIO: 11 (ref 9–20)
BUN: 12 mg/dL (ref 6–24)
CO2: 24 mmol/L (ref 18–29)
CREATININE: 1.05 mg/dL (ref 0.76–1.27)
Calcium: 9.3 mg/dL (ref 8.7–10.2)
Chloride: 102 mmol/L (ref 96–106)
GFR, EST AFRICAN AMERICAN: 93 mL/min/{1.73_m2} (ref >59)
GFR, EST NON AFRICAN AMERICAN: 81 mL/min/{1.73_m2} (ref >59)
Glucose: 90 mg/dL (ref 65–99)
POTASSIUM: 4.2 mmol/L (ref 3.5–5.2)
SODIUM: 143 mmol/L (ref 134–144)

## 2017-01-02 LAB — THYROID PANEL WITH TSH
FREE THYROXINE INDEX: 2 (ref 1.2–4.9)
T3 Uptake Ratio: 26 % (ref 24–39)
T4, Total: 7.5 ug/dL (ref 4.5–12.0)
TSH: 0.832 u[IU]/mL (ref 0.450–4.500)

## 2017-01-02 LAB — VITAMIN D 25 HYDROXY (VIT D DEFICIENCY, FRACTURES): VIT D 25 HYDROXY: 24.4 ng/mL — AB (ref 30.0–100.0)

## 2017-01-04 DIAGNOSIS — M50322 Other cervical disc degeneration at C5-C6 level: Secondary | ICD-10-CM | POA: Diagnosis not present

## 2017-01-04 DIAGNOSIS — M9901 Segmental and somatic dysfunction of cervical region: Secondary | ICD-10-CM | POA: Diagnosis not present

## 2017-01-04 DIAGNOSIS — M9902 Segmental and somatic dysfunction of thoracic region: Secondary | ICD-10-CM | POA: Diagnosis not present

## 2017-01-05 DIAGNOSIS — M9902 Segmental and somatic dysfunction of thoracic region: Secondary | ICD-10-CM | POA: Diagnosis not present

## 2017-01-05 DIAGNOSIS — M50322 Other cervical disc degeneration at C5-C6 level: Secondary | ICD-10-CM | POA: Diagnosis not present

## 2017-01-05 DIAGNOSIS — M9901 Segmental and somatic dysfunction of cervical region: Secondary | ICD-10-CM | POA: Diagnosis not present

## 2017-01-07 DIAGNOSIS — M50322 Other cervical disc degeneration at C5-C6 level: Secondary | ICD-10-CM | POA: Diagnosis not present

## 2017-01-07 DIAGNOSIS — M9901 Segmental and somatic dysfunction of cervical region: Secondary | ICD-10-CM | POA: Diagnosis not present

## 2017-01-07 DIAGNOSIS — M9902 Segmental and somatic dysfunction of thoracic region: Secondary | ICD-10-CM | POA: Diagnosis not present

## 2017-01-12 DIAGNOSIS — M9902 Segmental and somatic dysfunction of thoracic region: Secondary | ICD-10-CM | POA: Diagnosis not present

## 2017-01-12 DIAGNOSIS — M50322 Other cervical disc degeneration at C5-C6 level: Secondary | ICD-10-CM | POA: Diagnosis not present

## 2017-01-12 DIAGNOSIS — M9901 Segmental and somatic dysfunction of cervical region: Secondary | ICD-10-CM | POA: Diagnosis not present

## 2017-01-13 DIAGNOSIS — M50322 Other cervical disc degeneration at C5-C6 level: Secondary | ICD-10-CM | POA: Diagnosis not present

## 2017-01-13 DIAGNOSIS — M9902 Segmental and somatic dysfunction of thoracic region: Secondary | ICD-10-CM | POA: Diagnosis not present

## 2017-01-13 DIAGNOSIS — M9901 Segmental and somatic dysfunction of cervical region: Secondary | ICD-10-CM | POA: Diagnosis not present

## 2017-01-14 ENCOUNTER — Ambulatory Visit (HOSPITAL_COMMUNITY): Payer: Self-pay

## 2017-01-14 DIAGNOSIS — M50322 Other cervical disc degeneration at C5-C6 level: Secondary | ICD-10-CM | POA: Diagnosis not present

## 2017-01-14 DIAGNOSIS — M9901 Segmental and somatic dysfunction of cervical region: Secondary | ICD-10-CM | POA: Diagnosis not present

## 2017-01-14 DIAGNOSIS — M9902 Segmental and somatic dysfunction of thoracic region: Secondary | ICD-10-CM | POA: Diagnosis not present

## 2017-01-18 ENCOUNTER — Ambulatory Visit (HOSPITAL_COMMUNITY): Payer: Self-pay

## 2017-01-18 DIAGNOSIS — M9901 Segmental and somatic dysfunction of cervical region: Secondary | ICD-10-CM | POA: Diagnosis not present

## 2017-01-18 DIAGNOSIS — M50322 Other cervical disc degeneration at C5-C6 level: Secondary | ICD-10-CM | POA: Diagnosis not present

## 2017-01-18 DIAGNOSIS — M9902 Segmental and somatic dysfunction of thoracic region: Secondary | ICD-10-CM | POA: Diagnosis not present

## 2017-01-20 DIAGNOSIS — M9902 Segmental and somatic dysfunction of thoracic region: Secondary | ICD-10-CM | POA: Diagnosis not present

## 2017-01-20 DIAGNOSIS — M9901 Segmental and somatic dysfunction of cervical region: Secondary | ICD-10-CM | POA: Diagnosis not present

## 2017-01-20 DIAGNOSIS — M50322 Other cervical disc degeneration at C5-C6 level: Secondary | ICD-10-CM | POA: Diagnosis not present

## 2017-01-21 DIAGNOSIS — M9901 Segmental and somatic dysfunction of cervical region: Secondary | ICD-10-CM | POA: Diagnosis not present

## 2017-01-21 DIAGNOSIS — M50322 Other cervical disc degeneration at C5-C6 level: Secondary | ICD-10-CM | POA: Diagnosis not present

## 2017-01-21 DIAGNOSIS — M9902 Segmental and somatic dysfunction of thoracic region: Secondary | ICD-10-CM | POA: Diagnosis not present

## 2017-01-25 DIAGNOSIS — M9901 Segmental and somatic dysfunction of cervical region: Secondary | ICD-10-CM | POA: Diagnosis not present

## 2017-01-25 DIAGNOSIS — M50322 Other cervical disc degeneration at C5-C6 level: Secondary | ICD-10-CM | POA: Diagnosis not present

## 2017-01-25 DIAGNOSIS — M9902 Segmental and somatic dysfunction of thoracic region: Secondary | ICD-10-CM | POA: Diagnosis not present

## 2017-01-27 DIAGNOSIS — M50322 Other cervical disc degeneration at C5-C6 level: Secondary | ICD-10-CM | POA: Diagnosis not present

## 2017-01-27 DIAGNOSIS — M9901 Segmental and somatic dysfunction of cervical region: Secondary | ICD-10-CM | POA: Diagnosis not present

## 2017-01-27 DIAGNOSIS — M9902 Segmental and somatic dysfunction of thoracic region: Secondary | ICD-10-CM | POA: Diagnosis not present

## 2017-01-28 DIAGNOSIS — M9901 Segmental and somatic dysfunction of cervical region: Secondary | ICD-10-CM | POA: Diagnosis not present

## 2017-01-28 DIAGNOSIS — M50322 Other cervical disc degeneration at C5-C6 level: Secondary | ICD-10-CM | POA: Diagnosis not present

## 2017-01-28 DIAGNOSIS — M9902 Segmental and somatic dysfunction of thoracic region: Secondary | ICD-10-CM | POA: Diagnosis not present

## 2017-02-01 ENCOUNTER — Other Ambulatory Visit: Payer: Self-pay | Admitting: Family Medicine

## 2017-02-01 DIAGNOSIS — M9901 Segmental and somatic dysfunction of cervical region: Secondary | ICD-10-CM | POA: Diagnosis not present

## 2017-02-01 DIAGNOSIS — M9902 Segmental and somatic dysfunction of thoracic region: Secondary | ICD-10-CM | POA: Diagnosis not present

## 2017-02-01 DIAGNOSIS — M50322 Other cervical disc degeneration at C5-C6 level: Secondary | ICD-10-CM | POA: Diagnosis not present

## 2017-02-04 DIAGNOSIS — M9901 Segmental and somatic dysfunction of cervical region: Secondary | ICD-10-CM | POA: Diagnosis not present

## 2017-02-04 DIAGNOSIS — M50322 Other cervical disc degeneration at C5-C6 level: Secondary | ICD-10-CM | POA: Diagnosis not present

## 2017-02-04 DIAGNOSIS — M9902 Segmental and somatic dysfunction of thoracic region: Secondary | ICD-10-CM | POA: Diagnosis not present

## 2017-02-10 DIAGNOSIS — M50322 Other cervical disc degeneration at C5-C6 level: Secondary | ICD-10-CM | POA: Diagnosis not present

## 2017-02-10 DIAGNOSIS — M9902 Segmental and somatic dysfunction of thoracic region: Secondary | ICD-10-CM | POA: Diagnosis not present

## 2017-02-10 DIAGNOSIS — M9901 Segmental and somatic dysfunction of cervical region: Secondary | ICD-10-CM | POA: Diagnosis not present

## 2017-02-11 DIAGNOSIS — M9902 Segmental and somatic dysfunction of thoracic region: Secondary | ICD-10-CM | POA: Diagnosis not present

## 2017-02-11 DIAGNOSIS — M50322 Other cervical disc degeneration at C5-C6 level: Secondary | ICD-10-CM | POA: Diagnosis not present

## 2017-02-11 DIAGNOSIS — M9901 Segmental and somatic dysfunction of cervical region: Secondary | ICD-10-CM | POA: Diagnosis not present

## 2017-06-30 ENCOUNTER — Emergency Department (HOSPITAL_BASED_OUTPATIENT_CLINIC_OR_DEPARTMENT_OTHER)
Admission: EM | Admit: 2017-06-30 | Discharge: 2017-06-30 | Disposition: A | Discharge status: Home / Self Care | Payer: 59 | Attending: Emergency Medicine | Admitting: Emergency Medicine

## 2017-06-30 ENCOUNTER — Encounter (HOSPITAL_BASED_OUTPATIENT_CLINIC_OR_DEPARTMENT_OTHER): Payer: Self-pay

## 2017-06-30 ENCOUNTER — Other Ambulatory Visit: Payer: Self-pay

## 2017-06-30 ENCOUNTER — Emergency Department (HOSPITAL_COMMUNITY): Admission: EM | Admit: 2017-06-30 | Discharge: 2017-06-30 | Discharge status: Against Medical Advice | Payer: Self-pay

## 2017-06-30 DIAGNOSIS — Z79899 Other long term (current) drug therapy: Secondary | ICD-10-CM | POA: Insufficient documentation

## 2017-06-30 DIAGNOSIS — Z87891 Personal history of nicotine dependence: Secondary | ICD-10-CM | POA: Diagnosis not present

## 2017-06-30 DIAGNOSIS — Z87442 Personal history of urinary calculi: Secondary | ICD-10-CM | POA: Diagnosis not present

## 2017-06-30 DIAGNOSIS — R109 Unspecified abdominal pain: Secondary | ICD-10-CM | POA: Diagnosis not present

## 2017-06-30 HISTORY — DX: Calculus of kidney: N20.0

## 2017-06-30 LAB — URINALYSIS, MICROSCOPIC (REFLEX): WBC, UA: NONE SEEN WBC/hpf (ref 0–5)

## 2017-06-30 LAB — CBC WITH DIFFERENTIAL/PLATELET
BASOS ABS: 0.1 10*3/uL (ref 0.0–0.1)
BASOS PCT: 0 %
EOS ABS: 0.2 10*3/uL (ref 0.0–0.7)
Eosinophils Relative: 2 %
HCT: 42.2 % (ref 39.0–52.0)
HEMOGLOBIN: 14.5 g/dL (ref 13.0–17.0)
Lymphocytes Relative: 8 %
Lymphs Abs: 1 10*3/uL (ref 0.7–4.0)
MCH: 30.9 pg (ref 26.0–34.0)
MCHC: 34.4 g/dL (ref 30.0–36.0)
MCV: 89.8 fL (ref 78.0–100.0)
MONOS PCT: 5 %
Monocytes Absolute: 0.6 10*3/uL (ref 0.1–1.0)
NEUTROS ABS: 10.5 10*3/uL — AB (ref 1.7–7.7)
NEUTROS PCT: 85 %
Platelets: 203 10*3/uL (ref 150–400)
RBC: 4.7 MIL/uL (ref 4.22–5.81)
RDW: 13 % (ref 11.5–15.5)
WBC: 12.4 10*3/uL — AB (ref 4.0–10.5)

## 2017-06-30 LAB — URINALYSIS, ROUTINE W REFLEX MICROSCOPIC
Bilirubin Urine: NEGATIVE
GLUCOSE, UA: NEGATIVE mg/dL
Ketones, ur: NEGATIVE mg/dL
LEUKOCYTES UA: NEGATIVE
NITRITE: NEGATIVE
PH: 6 (ref 5.0–8.0)
Protein, ur: NEGATIVE mg/dL
SPECIFIC GRAVITY, URINE: 1.02 (ref 1.005–1.030)

## 2017-06-30 LAB — BASIC METABOLIC PANEL
ANION GAP: 6 (ref 5–15)
BUN: 17 mg/dL (ref 6–20)
CO2: 26 mmol/L (ref 22–32)
CREATININE: 1.15 mg/dL (ref 0.61–1.24)
Calcium: 9.1 mg/dL (ref 8.9–10.3)
Chloride: 102 mmol/L (ref 101–111)
GFR calc non Af Amer: >60 mL/min (ref >60)
Glucose, Bld: 95 mg/dL (ref 65–99)
Potassium: 4.1 mmol/L (ref 3.5–5.1)
SODIUM: 134 mmol/L — AB (ref 135–145)

## 2017-06-30 MED ORDER — ONDANSETRON 4 MG PO TBDP: 4.0000 mg | ORAL_TABLET | Freq: Three times a day (TID) | ORAL | 0 refills | Status: DC | PRN

## 2017-06-30 MED ORDER — OXYCODONE-ACETAMINOPHEN 5-325 MG PO TABS: 1.0000 | ORAL_TABLET | Freq: Four times a day (QID) | ORAL | 0 refills | Status: DC | PRN

## 2017-06-30 NOTE — ED Triage Notes (Signed)
C/o left flank pain x 2 weeks-feels is kidney stone-NAD-steady gait

## 2017-06-30 NOTE — Discharge Instructions (Signed)
Please take pain medicine as needed. You can alternate with Ibuprofen. Do not drink alcohol or drive while taking this medicine Take Zofran as needed for nausea Make appointment with Urology if pain returns

## 2017-06-30 NOTE — ED Provider Notes (Signed)
[facility] EMERGENCY DEPARTMENT Provider Note   CSN: [id number] Arrival date & time: 06/30/17  1704     History   Chief Complaint Chief Complaint  Patient presents with  . Flank Pain    HPI Chris Ali is a 56 y.o. male who presents with left-sided flank pain.  Past medical history significant for history of kidney stones.  The patient states over the past 2 weeks he has had intermittent left flank pain.  Feels very similar to prior kidney stones.  The pain has not been severe until today when he was at work.  The pain was very severe so he came to the ED.  He states since being in the ED the pain has resolved and now just feels like a discomfort in the area.  The pain radiates to the left lower quadrant and sometimes to the right side of the back.  He has had nausea without vomiting.  No fever, chest pain, shortness of breath, constipation or diarrhea.  He has had some difficulty urinating which also felt similar to when he had a kidney stone.  Dr. Eulogio Ditch is his urologist.  He has never had to have a stone removed.  HPI  Past Medical History:  Diagnosis Date  . Allergy   . Hyperlipidemia   . Kidney stone   . Neck pain   . Tinnitus     Patient Active Problem List   Diagnosis Date Noted  . Allergic rhinitis 12/21/2013  . Hyperlipidemia 12/21/2013  . Nephrolithiasis 12/21/2013  . Vitamin D deficiency 12/21/2013  . Hiatal hernia 12/21/2013  . Tinnitus of right ear 02/03/2013    Past Surgical History:  Procedure Laterality Date  . OPEN REPAIR PERIARTICULAR FRACTURE / DISLOCATION ELBOW Left 2004       Home Medications    Prior to Admission medications   Medication Sig Start Date End Date Taking? Authorizing Provider  atorvastatin (LIPITOR) 20 MG tablet TAKE 1 TABLET DAILY 02/01/17   Chipper Herb, MD  cetirizine (ZYRTEC) 10 MG tablet Take 10 mg by mouth at bedtime.    [provider]  ondansetron (ZOFRAN ODT) 4 MG disintegrating tablet  Take 1 tablet (4 mg total) every 8 (eight) hours as needed by mouth for nausea or vomiting. 06/30/17   Recardo Evangelist, PA-C  oxyCODONE-acetaminophen (PERCOCET/ROXICET) 5-325 MG tablet Take 1 tablet every 6 (six) hours as needed by mouth for severe pain. 06/30/17   Recardo Evangelist, PA-C    Family History Family History  Problem Relation Age of Onset  . Hypertension Mother   . Cancer Father        prostate  . Cancer Sister        breast  . Heart disease Maternal Grandmother   . Heart attack Maternal Grandmother 51  . Heart disease Maternal Grandfather   . Cancer Paternal Grandfather        colon  . Pancreatic cancer Paternal Grandfather   . Esophageal cancer Neg Hx   . Rectal cancer Neg Hx   . Stomach cancer Neg Hx     Social History Social History   Tobacco Use  . Smoking status: Former Smoker    Types: Cigarettes    Last attempt to quit: 02/04/1988    Years since quitting: 29.4  . Smokeless tobacco: Former Systems developer    Quit date: 08/17/1988  Substance Use Topics  . Alcohol use: Yes    Comment: rare  . Drug use: No  Allergies   Patient has no known allergies.   Review of Systems Review of Systems  Constitutional: Negative for fever.  Respiratory: Negative for shortness of breath.   Cardiovascular: Negative for chest pain.  Gastrointestinal: Positive for abdominal pain and nausea. Negative for constipation and vomiting.  Genitourinary: Positive for difficulty urinating and flank pain. Negative for hematuria, penile pain and testicular pain.  All other systems reviewed and are negative.    Physical Exam Updated Vital Signs BP (!) 152/86 (BP Location: Left Arm)   Pulse 82   Temp 97.7 F (36.5 C) (Oral)   Resp 16   Wt 81.9 kg (180 lb 9 oz)   SpO2 100%   BMI 29.14 kg/m   Physical Exam  Constitutional: He is oriented to person, place, and time. He appears well-developed and well-nourished. No distress.  HENT:  Head: Normocephalic and atraumatic.    Eyes: Conjunctivae are normal. Pupils are equal, round, and reactive to light. Right eye exhibits no discharge. Left eye exhibits no discharge. No scleral icterus.  Neck: Normal range of motion.  Cardiovascular: Normal rate and regular rhythm. Exam reveals no gallop and no friction rub.  No murmur heard. Pulmonary/Chest: Effort normal and breath sounds normal. No stridor. No respiratory distress. He has no wheezes. He has no rales. He exhibits no tenderness.  Abdominal: Soft. Bowel sounds are normal. He exhibits no distension and no mass. There is tenderness (mild RLQ tenderness, no LLQ tenderness). There is no rebound and no guarding. No hernia.  No CVA tenderness  Neurological: He is alert and oriented to person, place, and time.  Skin: Skin is warm and dry.  Psychiatric: He has a normal mood and affect. His behavior is normal.  Nursing note and vitals reviewed.    ED Treatments / Results  Labs (all labs ordered are listed, but only abnormal results are displayed) Labs Reviewed  URINALYSIS, ROUTINE W REFLEX MICROSCOPIC - Abnormal; Notable for the following components:      Result Value   Hgb urine dipstick MODERATE (*)    All other components within normal limits  URINALYSIS, MICROSCOPIC (REFLEX) - Abnormal; Notable for the following components:   Bacteria, UA RARE (*)    Squamous Epithelial / LPF 0-5 (*)    All other components within normal limits  CBC WITH DIFFERENTIAL/PLATELET - Abnormal; Notable for the following components:   WBC 12.4 (*)    Neutro Abs 10.5 (*)    All other components within normal limits  BASIC METABOLIC PANEL - Abnormal; Notable for the following components:   Sodium 134 (*)    All other components within normal limits    EKG  EKG Interpretation None       Radiology No results found.  Procedures Procedures (including critical care time)  Medications Ordered in ED Medications - No data to display   Initial Impression / Assessment and  Plan / ED Course  I have reviewed the triage vital signs and the nursing notes.  Pertinent labs & imaging results that were available during my care of the patient were reviewed by me and considered in my medical decision making (see chart for details).  56 year old male with left flank pain for the past several weeks.  Pain is colicky in nature and became more severe today.  He is hypertensive but otherwise vital signs are normal.  He has no CVA tenderness on exam since pain is resolved.  Interestingly he has mild right lower quadrant tenderness and left lower quadrant tenderness.  CBC is remarkable for mild leukocytosis.  BMP is remarkable for mildly elevated creatinine to 1.15 which is about his baseline.  She had decision making was made with patient.  He is comfortable not pursuing imaging at this time and treating symptomatically since he is pain-free and has never had to have surgical removal of a stone.  He will be given pain medicine and antiemetics.  He has a urologist who he can follow-up with.  Return precautions were given.  Final Clinical Impressions(s) / ED Diagnoses   Final diagnoses:  Left flank pain    ED Discharge Orders        Ordered    oxyCODONE-acetaminophen (PERCOCET/ROXICET) 5-325 MG tablet  Every 6 hours PRN     06/30/17 1856    ondansetron (ZOFRAN ODT) 4 MG disintegrating tablet  Every 8 hours PRN     06/30/17 1856       Recardo Evangelist, PA-C 06/30/17 1906    Veryl Speak, MD 06/30/17 2322

## 2017-07-06 ENCOUNTER — Ambulatory Visit: Payer: 59 | Admitting: Family Medicine

## 2017-08-04 ENCOUNTER — Other Ambulatory Visit: Payer: Self-pay | Admitting: Family Medicine

## 2017-08-18 IMAGING — DX DG CERVICAL SPINE COMPLETE 4+V
6 series · 6 of 6 positions shown · non-contrast
Comparison: Cervical spine films of 07/27/2011

CLINICAL DATA: Neck pain and numbness

EXAM:
CERVICAL SPINE - COMPLETE 4+ VIEW

[c-spine lat]
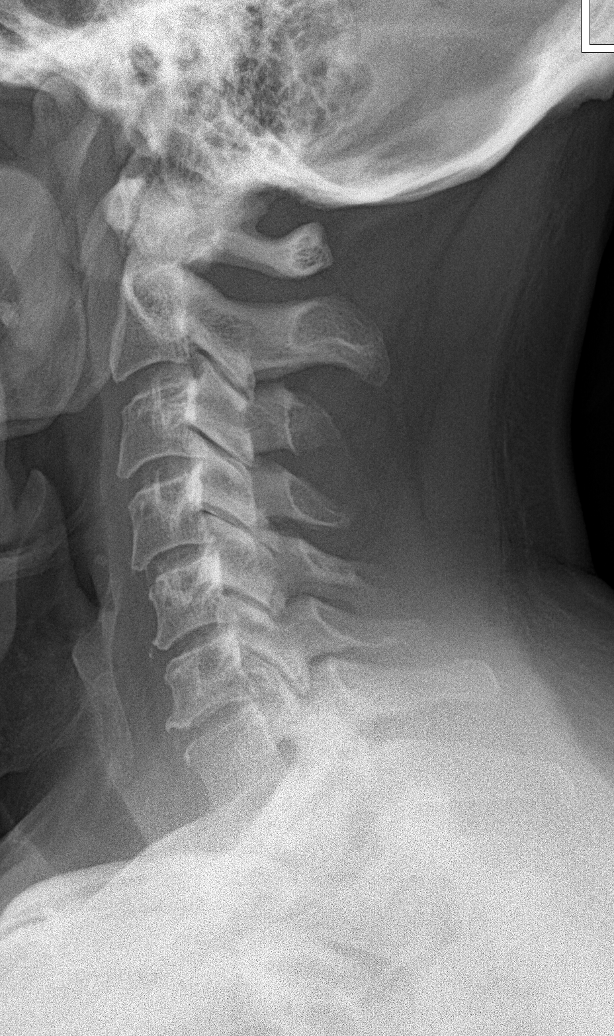

[c-spine obl (1 of 2)]
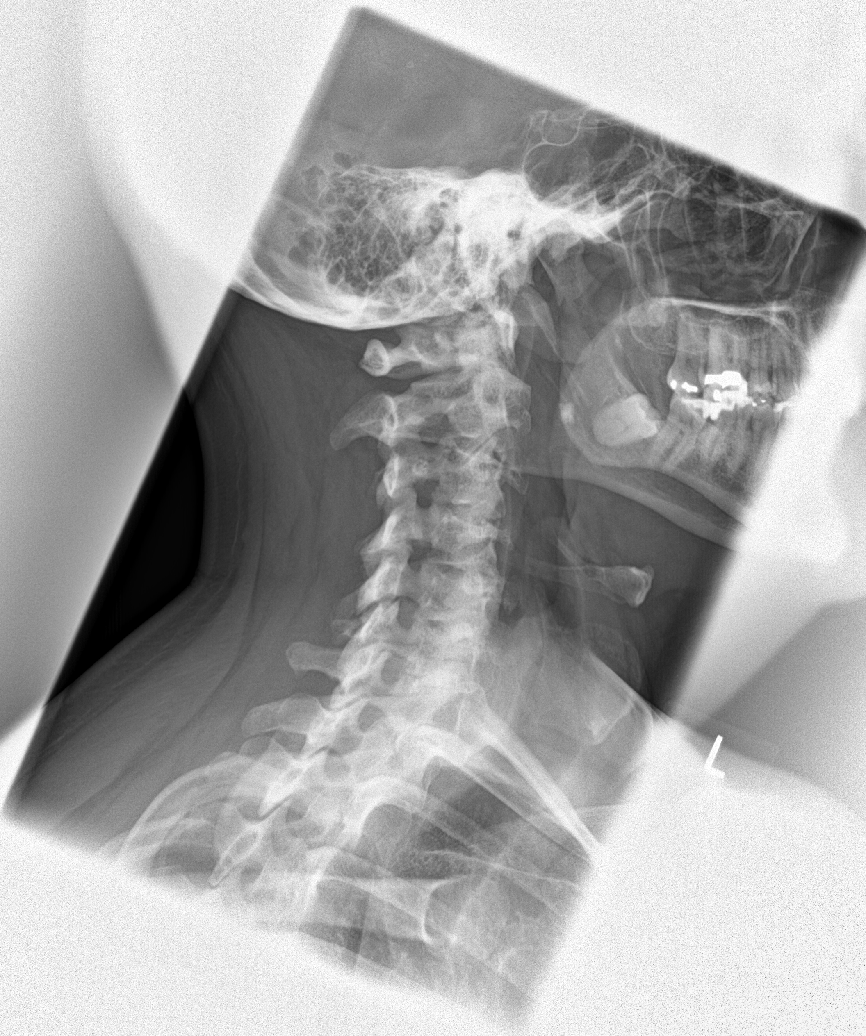

[c-spine obl (2 of 2)]
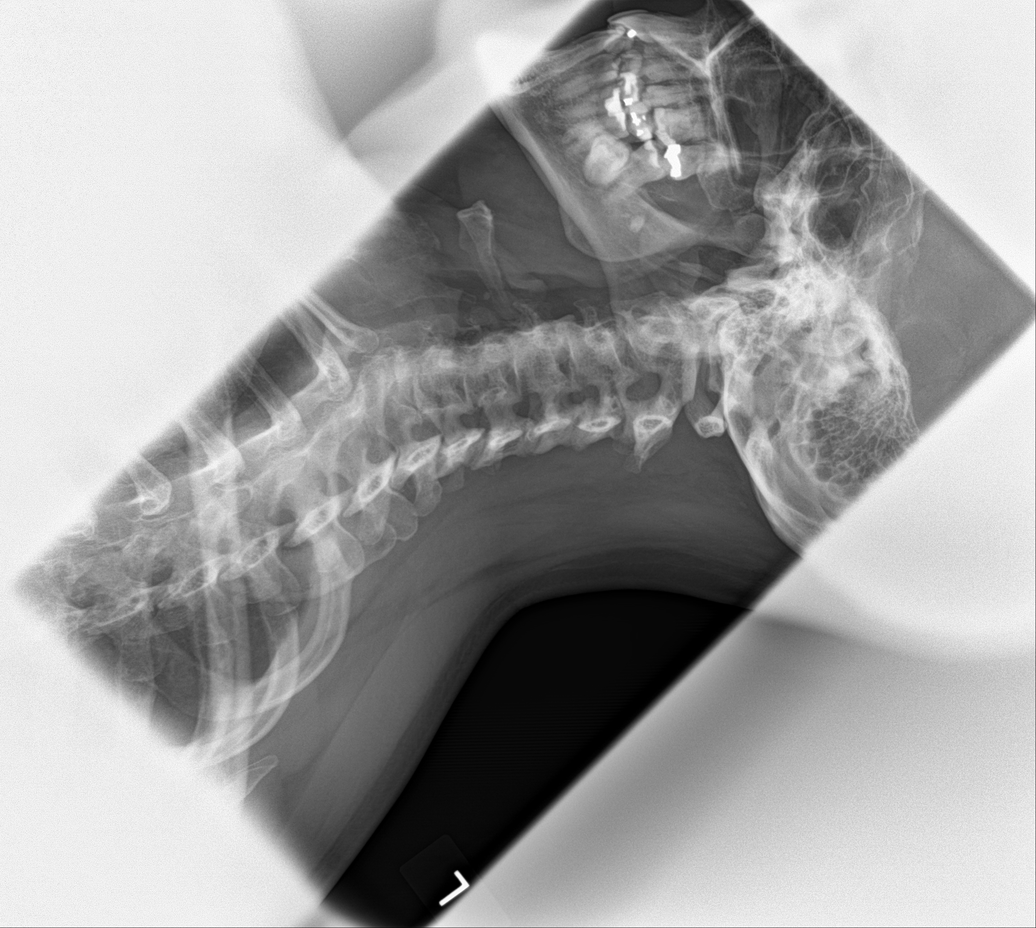

[c-spine ap]
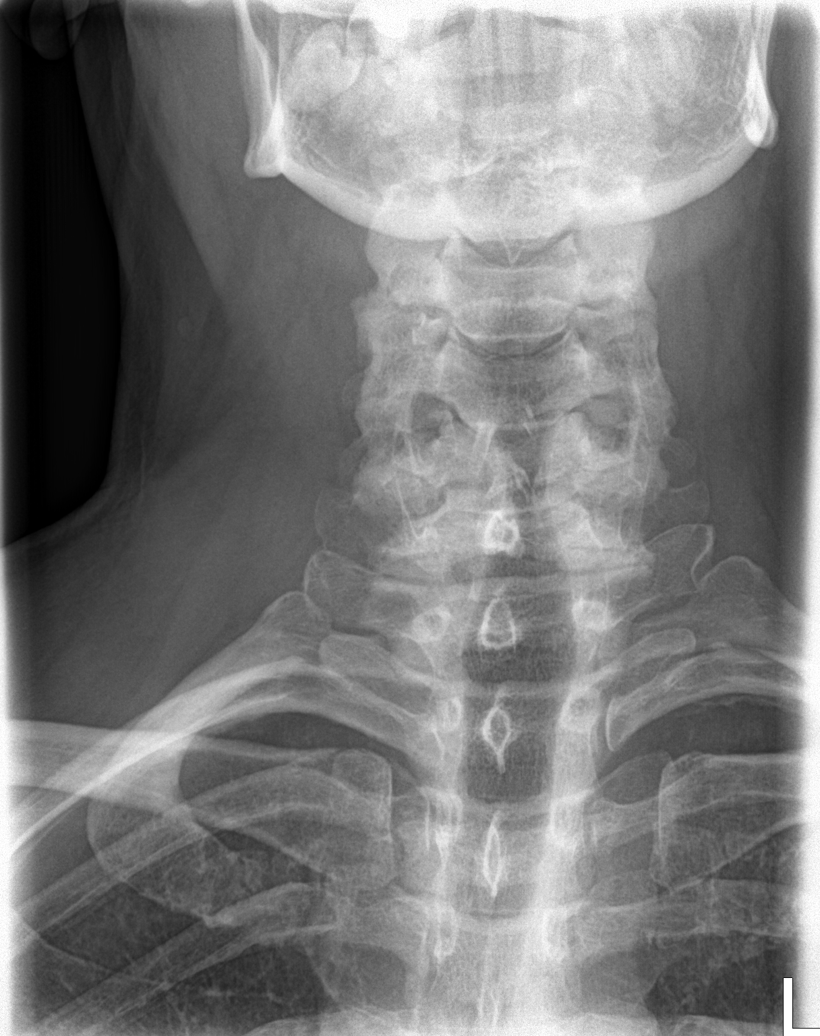

[c-spine open mouth (1 of 2)]
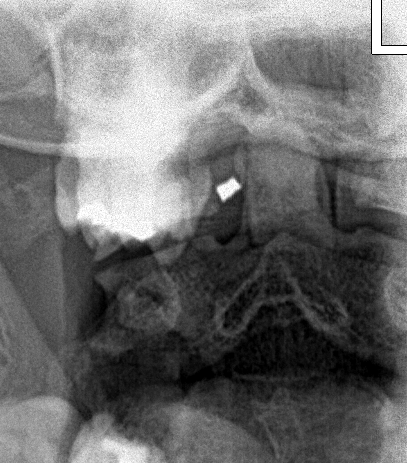

[c-spine open mouth (2 of 2)]
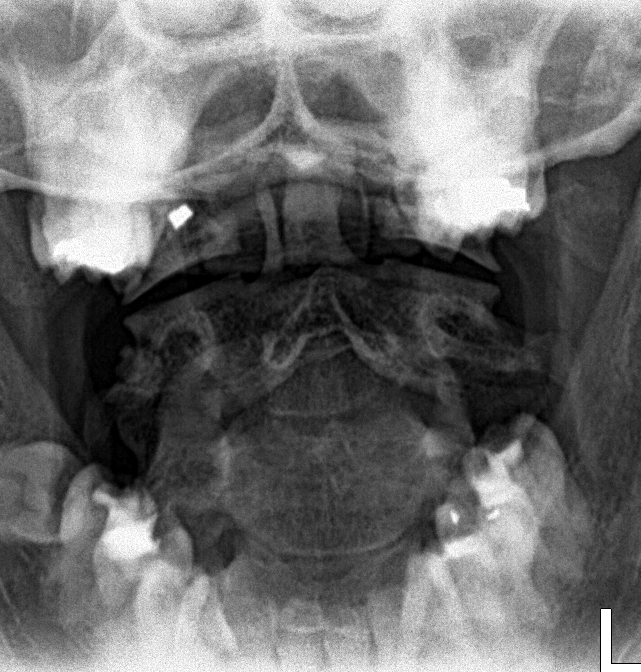

[6 of 6 positions shown; findings below may reference images not displayed]

FINDINGS: The cervical vertebrae remain in normal alignment. Mild degenerative
disc disease is present at C5-6 and C6-7 levels, where there is
slight loss of intervertebral disc space and anterior osteophyte
formation. No prevertebral soft tissue swelling is seen. On oblique
views, no significant foraminal narrowing is noted with only mild
narrowing at C5-6 and C6-7 levels. The odontoid process is intact.
The lung apices are clear.
IMPRESSION: Normal alignment with very mild degenerative disc disease at C5-6
and C6-7 levels.

## 2017-09-03 ENCOUNTER — Other Ambulatory Visit: Payer: Self-pay | Admitting: Family Medicine

## 2017-10-25 ENCOUNTER — Encounter: Payer: Self-pay | Admitting: Family Medicine

## 2017-10-25 ENCOUNTER — Ambulatory Visit (INDEPENDENT_AMBULATORY_CARE_PROVIDER_SITE_OTHER): Payer: 59 | Admitting: Family Medicine

## 2017-10-25 VITALS — BP 136/88 | HR 94 | Temp 99.5°F | Ht 66.0 in | Wt 178.0 lb

## 2017-10-25 DIAGNOSIS — J0100 Acute maxillary sinusitis, unspecified: Secondary | ICD-10-CM

## 2017-10-25 DIAGNOSIS — R6883 Chills (without fever): Secondary | ICD-10-CM | POA: Diagnosis not present

## 2017-10-25 LAB — VERITOR FLU A/B WAIVED
Influenza A: NEGATIVE
Influenza B: NEGATIVE

## 2017-10-25 MED ORDER — AMOXICILLIN-POT CLAVULANATE 875-125 MG PO TABS: 1.0000 | ORAL_TABLET | Freq: Two times a day (BID) | ORAL | 0 refills | Status: DC

## 2017-10-25 MED ORDER — PSEUDOEPHEDRINE-GUAIFENESIN ER 120-1200 MG PO TB12: ORAL_TABLET | ORAL | 0 refills | Status: DC

## 2017-10-25 NOTE — Progress Notes (Signed)
Chief Complaint  Patient presents with  . Fever    pt here today c/o fever, chills, feeling terrible and he has been exposed to the flu    HPI  Patient presents today for patient presents with cough productive of yellow sputum, sinus pressure and burning in the cheeks.  He has had runny stuffy nose. Diffuse headache of moderate intensity. Patient also had chills and sweats last night. Moderate malaise first two days. Better yesterday. Onset 4 days ago.   PMH: Smoking status noted ROS: Per HPI  Objective: BP 136/88   Pulse 94   Temp 99.5 F (37.5 C) (Oral)   Ht 5\' 6"  (1.676 m)   Wt 178 lb (80.7 kg)   BMI 28.73 kg/m  Gen: NAD, alert, cooperative with exam HEENT: NCAT, EOMI, PERRL.  Maxillary sinuses are tender nasal passages are boggy. CV: RRR, good S1/S2, no murmur Resp: CTABL, no wheezes, non-labored Abd: SNTND, BS present, no guarding or organomegaly Ext: No edema, warm Neuro: Alert and oriented, No gross deficits  Assessment and plan:  1. Chills   2. Acute maxillary sinusitis, recurrence not specified     Meds ordered this encounter  Medications  . amoxicillin-clavulanate (AUGMENTIN) 875-125 MG tablet    Sig: Take 1 tablet by mouth 2 (two) times daily.    Dispense:  20 tablet    Refill:  0  . Pseudoephedrine-Guaifenesin (MUCINEX D MAX STRENGTH) [phone] MG TB12    Sig: Take 1 by mouth twice daily as needed for congestion.    Dispense:  20 each    Refill:  0    Orders Placed This Encounter  Procedures  . Veritor Flu A/B Waived    Order Specific Question:   Source    Answer:   nasal    Follow up as needed.  Claretta Fraise, MD

## 2018-01-14 ENCOUNTER — Ambulatory Visit (INDEPENDENT_AMBULATORY_CARE_PROVIDER_SITE_OTHER): Payer: 59 | Admitting: Family Medicine

## 2018-01-14 ENCOUNTER — Encounter: Payer: Self-pay | Admitting: Family Medicine

## 2018-01-14 VITALS — BP 111/70 | HR 78 | Temp 97.7°F | Ht 66.0 in | Wt 173.0 lb

## 2018-01-14 DIAGNOSIS — Z0000 Encounter for general adult medical examination without abnormal findings: Secondary | ICD-10-CM

## 2018-01-14 DIAGNOSIS — J301 Allergic rhinitis due to pollen: Secondary | ICD-10-CM

## 2018-01-14 DIAGNOSIS — E559 Vitamin D deficiency, unspecified: Secondary | ICD-10-CM

## 2018-01-14 DIAGNOSIS — M542 Cervicalgia: Secondary | ICD-10-CM

## 2018-01-14 DIAGNOSIS — H833X1 Noise effects on right inner ear: Secondary | ICD-10-CM

## 2018-01-14 DIAGNOSIS — E7800 Pure hypercholesterolemia, unspecified: Secondary | ICD-10-CM

## 2018-01-14 DIAGNOSIS — Z7689 Persons encountering health services in other specified circumstances: Secondary | ICD-10-CM | POA: Diagnosis not present

## 2018-01-14 DIAGNOSIS — W57XXXA Bitten or stung by nonvenomous insect and other nonvenomous arthropods, initial encounter: Secondary | ICD-10-CM | POA: Diagnosis not present

## 2018-01-14 DIAGNOSIS — N400 Enlarged prostate without lower urinary tract symptoms: Secondary | ICD-10-CM

## 2018-01-14 LAB — URINALYSIS, COMPLETE
BILIRUBIN UA: NEGATIVE
GLUCOSE, UA: NEGATIVE
LEUKOCYTES UA: NEGATIVE
Nitrite, UA: NEGATIVE
PROTEIN UA: NEGATIVE
RBC UA: NEGATIVE
SPEC GRAV UA: 1.015 (ref 1.005–1.030)
Urobilinogen, Ur: 0.2 mg/dL (ref 0.2–1.0)
pH, UA: 7 (ref 5.0–7.5)

## 2018-01-14 LAB — MICROSCOPIC EXAMINATION
Bacteria, UA: NONE SEEN
EPITHELIAL CELLS (NON RENAL): NONE SEEN /HPF (ref 0–10)
RBC, UA: NONE SEEN /hpf (ref 0–2)
Renal Epithel, UA: NONE SEEN /hpf
WBC, UA: NONE SEEN /hpf (ref 0–5)

## 2018-01-14 MED ORDER — FLUTICASONE PROPIONATE 50 MCG/ACT NA SUSP: 2.0000 | Freq: Every day | NASAL | 6 refills | Status: DC

## 2018-01-14 MED ORDER — ATORVASTATIN CALCIUM 20 MG PO TABS: 20.0000 mg | ORAL_TABLET | Freq: Every day | ORAL | 3 refills | Status: DC

## 2018-01-14 MED ORDER — DOXYCYCLINE HYCLATE 100 MG PO TABS: 100.0000 mg | ORAL_TABLET | Freq: Two times a day (BID) | ORAL | 0 refills | Status: DC

## 2018-01-14 NOTE — Patient Instructions (Addendum)
Continue current medications. Continue good therapeutic lifestyle changes which include good diet and exercise. Fall precautions discussed with patient. If an FOBT was given today- please return it to our front desk. If you are over 55 years old - you may need Prevnar 55 or the adult Pneumonia vaccine.  **Flu shots are available--- please call and schedule a FLU-CLINIC appointment**  After your visit with [place] today you will receive a survey in the mail or online from [employer] regarding your care with [place]. Please take a moment to fill this out. Your feedback is very important to [place] as you can help [place] better understand your patient needs as well as improve your experience and satisfaction. WE CARE ABOUT YOU!!!   Please check with your father regarding any family history of colon cancer We will arrange for you to have an appointment with Dr. Vicie Mutters to further evaluate and make recommendations about the lack of hearing in the right ear Check with the [facility] [address] regarding magnesium ointment and creams it can be massaged into muscles of the neck to help pain We will call in a prescription for Flonase If you need anything additional a leukotriene receptor blocker could be  helpful like Singulair We would also ask you to go ahead and take doxycycline 100 mg twice daily with food for 2 weeks We will check tick titers with your lab work

## 2018-01-14 NOTE — Progress Notes (Signed)
Subjective:    Patient ID: Chris Ali, male    DOB: 04-25-1961, 57 y.o.   MRN: [id number]  HPI Patient is here today for annual wellness exam and follow up of chronic medical problems which includes hyperlipidemia. He is taking medication regularly.  The patient is here today for complete physical.  He is complaining of decreased hearing in the right ear and this may be attributed to increased sinus and allergy problems.  He also only discussed some neck issues.  He is due for rectal prostate PSA FOBT and lab work.  He also get a urinalysis.  He had a stress test in August 2017.  His weight is down 5 pounds since the last visit.  His last colonoscopy was in May 2015.  He is currently taking his eyes all and Sudafed.  Patient has a long history of allergies and also has had a history of a kidney stone.  He had an injury to the elbow and unfortunately is unable to fully extend this as a result of the injury and this is been from years past.  The patient's father has had prostate cancer and his sister has had breast cancer.  His paternal grandfather has had colon cancer.  He is expired.  High blood pressure also runs on his mother's side.  The patient is currently taking as already mentioned Xyzal and atorvastatin.  He had C-spine films in May 2017 which showed very mild degenerative disc disease at C5 and 6 and C6 and 7.  Patient today denies any chest pain pressure or tightness or shortness of breath.  He denies any trouble with swallowing heartburn indigestion nausea vomiting diarrhea blood in the stool black tarry bowel movements or change in bowel habits.  He is passing his water without problems.  He is concerned about his ongoing problems with allergies and decreased hearing in the right ear from which he had an injury several years back.  He is interested in going to have this further evaluated.  We will make him an appointment with an ear specialist.  He denies any trouble with passing his  water.     Patient Active Problem List   Diagnosis Date Noted  . Allergic rhinitis 12/21/2013  . Hyperlipidemia 12/21/2013  . Nephrolithiasis 12/21/2013  . Vitamin D deficiency 12/21/2013  . Hiatal hernia 12/21/2013  . Tinnitus of right ear 02/03/2013   Outpatient Encounter Medications as of 01/14/2018  Medication Sig  . levocetirizine (XYZAL) 5 MG tablet Take 5 mg by mouth every evening.  Marland Kitchen atorvastatin (LIPITOR) 20 MG tablet TAKE 1 TABLET DAILY (Patient not taking: Reported on 01/14/2018)  . [DISCONTINUED] amoxicillin-clavulanate (AUGMENTIN) 875-125 MG tablet Take 1 tablet by mouth 2 (two) times daily.  . [DISCONTINUED] Pseudoephedrine-Guaifenesin (MUCINEX D MAX STRENGTH) [phone] MG TB12 Take 1 by mouth twice daily as needed for congestion.   No facility-administered encounter medications on file as of 01/14/2018.      Review of Systems  Constitutional: Negative.   HENT: Positive for hearing loss (right ear ).        Seasonal allergies   Eyes: Negative.   Respiratory: Negative.   Cardiovascular: Negative.   Gastrointestinal: Negative.   Endocrine: Negative.   Genitourinary: Negative.   Musculoskeletal: Negative.   Skin: Negative.   Allergic/Immunologic: Negative.   Neurological: Negative.   Hematological: Negative.   Psychiatric/Behavioral: Negative.        Objective:   Physical Exam  Constitutional: He is oriented to person, place, and  time. He appears well-developed and well-nourished. No distress.  The patient is pleasant alert and in good spirits  HENT:  Head: Normocephalic and atraumatic.  Right Ear: External ear normal.  Left Ear: External ear normal.  Mouth/Throat: Oropharynx is clear and moist. No oropharyngeal exudate.  Nasal turbinate congestion bilaterally, left greater than right and both TMs appear normal bilaterally.  Diminished hearing right ear  Eyes: Pupils are equal, round, and reactive to light. Conjunctivae and EOM are normal. Right eye  exhibits no discharge. Left eye exhibits no discharge.  Neck: Normal range of motion. Neck supple. No thyromegaly present.  No bruits thyromegaly or anterior cervical adenopathy  Cardiovascular: Normal rate, regular rhythm, normal heart sounds and intact distal pulses.  No murmur heard. Heart is regular at 60/min  Pulmonary/Chest: Effort normal and breath sounds normal. He has no wheezes. He has no rales. He exhibits no tenderness.  Clear anteriorly and posteriorly no axillary adenopathy or chest wall masses  Abdominal: Soft. Bowel sounds are normal. He exhibits no mass. There is no tenderness. There is no rebound and no guarding.  No liver or spleen enlargement epigastric tenderness bruits or inguinal adenopathy  Genitourinary: Rectum normal and penis normal.  Genitourinary Comments: The prostate is slightly enlarged but soft and smooth.  There were no rectal masses.  External genitalia were within normal limits.  Recent tick bite noted on shaft of penis  Musculoskeletal: Normal range of motion. He exhibits no edema.  Lymphadenopathy:    He has no cervical adenopathy.  Neurological: He is alert and oriented to person, place, and time. He has normal reflexes. No cranial nerve deficit.  Skin: Skin is warm and dry. No rash noted. There is erythema.  Swelling of skin near tick bite  Psychiatric: He has a normal mood and affect. His behavior is normal. Judgment and thought content normal.  Patient alert and in good spirits  Nursing note and vitals reviewed.   BP 111/70 (BP Location: Left Arm)   Pulse 78   Temp 97.7 F (36.5 C) (Oral)   Ht 5\' 6"  (1.676 m)   Wt 173 lb (78.5 kg)   BMI 27.92 kg/m        Assessment & Plan:  1. Annual physical exam -Patient will get lab work and because of worsening hearing problems and right ear will be scheduled for a visit with the ear specialist, Dr. Thornell Mule.  2. Pure hypercholesterolemia -Continue with current treatment and aggressive therapeutic  lifestyle changes pending results of lab work  3. Vitamin D deficiency -Continue with vitamin D replacement pending results of lab work  4. Benign prostatic hyperplasia, unspecified whether lower urinary tract symptoms present -The prostate remains slightly enlarged without any lumps or masses and patient is having no symptoms with this.  5. Neck pain -This is been helped considerably by chiropractic manipulation and the patient is pleased with the degenerative changes he has in his neck and the treatment he is currently getting.  6. Seasonal allergic rhinitis due to pollen -The patient will continue with Xyzal and we will add Flonase and nasal steroid to this 1 to 2 sprays each nostril at bedtime.  If he continues to have problems we mention a leukotriene receptor blocker could be added if necessary.  7. Noise-induced hearing loss of right ear, unspecified hearing status on contralateral side -Referral to ear specialist, Dr. Vicie Mutters for further evaluation and management  8.  Tick bite -Doxycycline 100 mg twice daily with food for 2 weeks  Meds ordered this encounter  Medications  . fluticasone (FLONASE) 50 MCG/ACT nasal spray    Sig: Place 2 sprays into both nostrils daily.    Dispense:  16 g    Refill:  6  . doxycycline (VIBRA-TABS) 100 MG tablet    Sig: Take 1 tablet (100 mg total) by mouth 2 (two) times daily. 1 po bid    Dispense:  28 tablet    Refill:  0   Patient Instructions  Continue current medications. Continue good therapeutic lifestyle changes which include good diet and exercise. Fall precautions discussed with patient. If an FOBT was given today- please return it to our front desk. If you are over 56 years old - you may need Prevnar 64 or the adult Pneumonia vaccine.  **Flu shots are available--- please call and schedule a FLU-CLINIC appointment**  After your visit with [place] today you will receive a survey in the mail or online from [employer] regarding your  care with [place]. Please take a moment to fill this out. Your feedback is very important to [place] as you can help [place] better understand your patient needs as well as improve your experience and satisfaction. WE CARE ABOUT YOU!!!   Please check with your father regarding any family history of colon cancer We will arrange for you to have an appointment with Dr. Vicie Mutters to further evaluate and make recommendations about the lack of hearing in the right ear Check with the [facility] [address] regarding magnesium ointment and creams it can be massaged into muscles of the neck to help pain We will call in a prescription for Flonase If you need anything additional a leukotriene receptor blocker could be  helpful like Singulair We would also ask you to go ahead and take doxycycline 100 mg twice daily with food for 2 weeks We will check tick titers with your lab work  Arrie Senate MD

## 2018-01-15 LAB — BMP8+EGFR
BUN / CREAT RATIO: 12 (ref 9–20)
BUN: 12 mg/dL (ref 6–24)
CALCIUM: 9.2 mg/dL (ref 8.7–10.2)
CO2: 22 mmol/L (ref 20–29)
CREATININE: 1.01 mg/dL (ref 0.76–1.27)
Chloride: 103 mmol/L (ref 96–106)
GFR, EST AFRICAN AMERICAN: 97 mL/min/{1.73_m2} (ref >59)
GFR, EST NON AFRICAN AMERICAN: 84 mL/min/{1.73_m2} (ref >59)
Glucose: 84 mg/dL (ref 65–99)
Potassium: 4.1 mmol/L (ref 3.5–5.2)
Sodium: 142 mmol/L (ref 134–144)

## 2018-01-15 LAB — THYROID PANEL WITH TSH
FREE THYROXINE INDEX: 1.7 (ref 1.2–4.9)
T3 UPTAKE RATIO: 26 % (ref 24–39)
T4, Total: 6.4 ug/dL (ref 4.5–12.0)
TSH: 0.725 u[IU]/mL (ref 0.450–4.500)

## 2018-01-15 LAB — LIPID PANEL
CHOL/HDL RATIO: 4.8 ratio (ref 0.0–5.0)
Cholesterol, Total: 215 mg/dL — ABNORMAL HIGH (ref 100–199)
HDL: 45 mg/dL (ref >39)
LDL CALC: 142 mg/dL — AB (ref 0–99)
Triglycerides: 139 mg/dL (ref 0–149)
VLDL CHOLESTEROL CAL: 28 mg/dL (ref 5–40)

## 2018-01-15 LAB — PSA, TOTAL AND FREE
PSA FREE: 0.18 ng/mL
PSA, Free Pct: 20 %
Prostate Specific Ag, Serum: 0.9 ng/mL (ref 0.0–4.0)

## 2018-01-15 LAB — CBC WITH DIFFERENTIAL/PLATELET
BASOS ABS: 0 10*3/uL (ref 0.0–0.2)
BASOS: 1 %
EOS (ABSOLUTE): 0.2 10*3/uL (ref 0.0–0.4)
EOS: 3 %
HEMATOCRIT: 42.4 % (ref 37.5–51.0)
HEMOGLOBIN: 14.4 g/dL (ref 13.0–17.7)
IMMATURE GRANS (ABS): 0 10*3/uL (ref 0.0–0.1)
Immature Granulocytes: 0 %
LYMPHS: 19 %
Lymphocytes Absolute: 1.1 10*3/uL (ref 0.7–3.1)
MCH: 30.1 pg (ref 26.6–33.0)
MCHC: 34 g/dL (ref 31.5–35.7)
MCV: 89 fL (ref 79–97)
MONOCYTES: 7 %
Monocytes Absolute: 0.4 10*3/uL (ref 0.1–0.9)
NEUTROS ABS: 4.2 10*3/uL (ref 1.4–7.0)
Neutrophils: 70 %
Platelets: 234 10*3/uL (ref 150–450)
RBC: 4.78 x10E6/uL (ref 4.14–5.80)
RDW: 13.6 % (ref 12.3–15.4)
WBC: 6 10*3/uL (ref 3.4–10.8)

## 2018-01-15 LAB — VITAMIN D 25 HYDROXY (VIT D DEFICIENCY, FRACTURES): Vit D, 25-Hydroxy: 22.2 ng/mL — ABNORMAL LOW (ref 30.0–100.0)

## 2018-01-15 LAB — HEPATIC FUNCTION PANEL
ALBUMIN: 4.5 g/dL (ref 3.5–5.5)
ALT: 27 IU/L (ref 0–44)
AST: 29 IU/L (ref 0–40)
Alkaline Phosphatase: 59 IU/L (ref 39–117)
Bilirubin Total: 0.6 mg/dL (ref 0.0–1.2)
Bilirubin, Direct: 0.15 mg/dL (ref 0.00–0.40)
TOTAL PROTEIN: 7.1 g/dL (ref 6.0–8.5)

## 2018-01-20 LAB — LYME, WESTERN BLOT, SERUM (REFLEXED)
IGG P18 AB.: ABSENT
IGG P23 AB.: ABSENT
IGG P28 AB.: ABSENT
IGG P30 AB.: ABSENT
IGG P39 AB.: ABSENT
IGG P93 AB.: ABSENT
IGM P23 AB.: ABSENT
IGM P39 AB.: ABSENT
IGM P41 AB.: ABSENT
IgG P41 Ab.: ABSENT
IgG P45 Ab.: ABSENT
IgG P58 Ab.: ABSENT
IgG P66 Ab.: ABSENT
Lyme IgG Wb: NEGATIVE
Lyme IgM Wb: NEGATIVE

## 2018-01-20 LAB — LYME AB/WESTERN BLOT REFLEX
LYME DISEASE AB, QUANT, IGM: 0.94 index — ABNORMAL HIGH (ref 0.00–0.79)
Lyme IgG/IgM Ab: <0.91 {ISR} (ref 0.00–0.90)

## 2018-01-20 LAB — ROCKY MTN SPOTTED FVR ABS PNL(IGG+IGM)
RMSF IGG: NEGATIVE
RMSF IgM: 0.2 index (ref 0.00–0.89)

## 2018-01-21 ENCOUNTER — Other Ambulatory Visit: Payer: Self-pay | Admitting: *Deleted

## 2018-01-21 MED ORDER — VITAMIN D (ERGOCALCIFEROL) 1.25 MG (50000 UNIT) PO CAPS: 50000.0000 [IU] | ORAL_CAPSULE | ORAL | 4 refills | Status: DC

## 2018-02-09 DIAGNOSIS — H9311 Tinnitus, right ear: Secondary | ICD-10-CM | POA: Diagnosis not present

## 2018-02-09 DIAGNOSIS — H833X1 Noise effects on right inner ear: Secondary | ICD-10-CM | POA: Diagnosis not present

## 2018-02-09 DIAGNOSIS — H903 Sensorineural hearing loss, bilateral: Secondary | ICD-10-CM | POA: Diagnosis not present

## 2018-02-21 ENCOUNTER — Telehealth: Payer: Self-pay | Admitting: Family Medicine

## 2018-02-21 DIAGNOSIS — M542 Cervicalgia: Secondary | ICD-10-CM

## 2018-02-21 NOTE — Telephone Encounter (Signed)
Pt would like [facility] to order Cervical MRI for his neck pain, offered to send call to provider covering but pt requested call go to [place] since he knows his history.

## 2018-02-22 ENCOUNTER — Other Ambulatory Visit: Payer: Self-pay | Admitting: Otolaryngology

## 2018-02-22 DIAGNOSIS — T1590XA Foreign body on external eye, part unspecified, unspecified eye, initial encounter: Secondary | ICD-10-CM

## 2018-02-22 DIAGNOSIS — H9311 Tinnitus, right ear: Secondary | ICD-10-CM

## 2018-02-22 DIAGNOSIS — H903 Sensorineural hearing loss, bilateral: Secondary | ICD-10-CM

## 2018-02-22 DIAGNOSIS — M542 Cervicalgia: Secondary | ICD-10-CM

## 2018-02-22 NOTE — Telephone Encounter (Signed)
Get MRI of the C-spine because of neuropathy that is persistent

## 2018-02-23 NOTE — Telephone Encounter (Signed)
Pt notified of order Will be scheduled by Dr Thornell Mule office

## 2018-08-31 ENCOUNTER — Other Ambulatory Visit: Payer: Self-pay | Admitting: Otolaryngology

## 2018-08-31 DIAGNOSIS — H903 Sensorineural hearing loss, bilateral: Secondary | ICD-10-CM

## 2018-08-31 DIAGNOSIS — H9311 Tinnitus, right ear: Secondary | ICD-10-CM

## 2018-09-09 ENCOUNTER — Ambulatory Visit
Admission: RE | Admit: 2018-09-09 | Discharge: 2018-09-09 | Disposition: A | Discharge status: Home / Self Care | Payer: 59 | Source: Ambulatory Visit | Attending: Otolaryngology | Admitting: Otolaryngology

## 2018-09-09 DIAGNOSIS — M542 Cervicalgia: Secondary | ICD-10-CM

## 2018-09-09 DIAGNOSIS — H903 Sensorineural hearing loss, bilateral: Secondary | ICD-10-CM

## 2018-09-09 DIAGNOSIS — H9311 Tinnitus, right ear: Secondary | ICD-10-CM

## 2018-09-09 DIAGNOSIS — D333 Benign neoplasm of cranial nerves: Secondary | ICD-10-CM | POA: Diagnosis not present

## 2018-09-09 DIAGNOSIS — Z77018 Contact with and (suspected) exposure to other hazardous metals: Secondary | ICD-10-CM | POA: Diagnosis not present

## 2018-09-09 DIAGNOSIS — T1590XA Foreign body on external eye, part unspecified, unspecified eye, initial encounter: Secondary | ICD-10-CM

## 2018-09-09 MED ORDER — GADOBENATE DIMEGLUMINE 529 MG/ML IV SOLN
20.0000 mL | Freq: Once | INTRAVENOUS | Status: AC | PRN
  Administered 2018-09-09: 20 mL via INTRAVENOUS

## 2018-09-16 DIAGNOSIS — H903 Sensorineural hearing loss, bilateral: Secondary | ICD-10-CM | POA: Diagnosis not present

## 2018-09-16 DIAGNOSIS — H9311 Tinnitus, right ear: Secondary | ICD-10-CM | POA: Diagnosis not present

## 2018-09-16 DIAGNOSIS — H933X1 Disorders of right acoustic nerve: Secondary | ICD-10-CM | POA: Diagnosis not present

## 2018-09-29 DIAGNOSIS — D333 Benign neoplasm of cranial nerves: Secondary | ICD-10-CM | POA: Diagnosis not present

## 2018-09-29 DIAGNOSIS — H9041 Sensorineural hearing loss, unilateral, right ear, with unrestricted hearing on the contralateral side: Secondary | ICD-10-CM | POA: Diagnosis not present

## 2018-09-29 DIAGNOSIS — G509 Disorder of trigeminal nerve, unspecified: Secondary | ICD-10-CM | POA: Diagnosis not present

## 2018-10-31 DIAGNOSIS — D333 Benign neoplasm of cranial nerves: Secondary | ICD-10-CM | POA: Insufficient documentation

## 2018-11-11 ENCOUNTER — Other Ambulatory Visit: Payer: Self-pay | Admitting: *Deleted

## 2018-11-11 MED ORDER — FLUTICASONE PROPIONATE 50 MCG/ACT NA SUSP: 2.0000 | Freq: Every day | NASAL | 4 refills | Status: AC

## 2018-11-11 NOTE — Telephone Encounter (Signed)
Pt aware refill sent in till next appt

## 2018-12-23 ENCOUNTER — Telehealth: Payer: Self-pay | Admitting: Family Medicine

## 2019-01-02 MED ORDER — HYDRALAZINE HCL 20 MG/ML IJ SOLN
10.00 | INTRAMUSCULAR | Status: DC
Start: ? — End: 2019-01-02

## 2019-01-02 MED ORDER — LISINOPRIL 5 MG PO TABS
10.00 | ORAL_TABLET | ORAL | Status: DC
Start: 2019-01-03 — End: 2019-01-02

## 2019-01-02 MED ORDER — DEXAMETHASONE 0.5 MG PO TABS
1.00 | ORAL_TABLET | ORAL | Status: DC
Start: 2019-01-02 — End: 2019-01-02

## 2019-01-02 MED ORDER — REFRESH LACRI-LUBE OP OINT
TOPICAL_OINTMENT | OPHTHALMIC | Status: DC
Start: 2019-01-02 — End: 2019-01-02

## 2019-01-02 MED ORDER — GENERIC EXTERNAL MEDICATION
Status: DC
Start: ? — End: 2019-01-02

## 2019-01-02 MED ORDER — CARBOXYMETHYLCELLULOSE SODIUM 0.5 % OP SOLN
1.00 | OPHTHALMIC | Status: DC
Start: 2019-01-02 — End: 2019-01-02

## 2019-01-02 MED ORDER — FAMOTIDINE 20 MG PO TABS
20.00 | ORAL_TABLET | ORAL | Status: DC
Start: 2019-01-02 — End: 2019-01-02

## 2019-01-02 MED ORDER — ATORVASTATIN CALCIUM 10 MG PO TABS
20.00 | ORAL_TABLET | ORAL | Status: DC
Start: 2019-01-03 — End: 2019-01-02

## 2019-01-02 MED ORDER — ONDANSETRON HCL 4 MG/2ML IJ SOLN
4.00 | INTRAMUSCULAR | Status: DC
Start: ? — End: 2019-01-02

## 2019-01-02 MED ORDER — ERYTHROMYCIN 5 MG/GM OP OINT
TOPICAL_OINTMENT | OPHTHALMIC | Status: DC
Start: 2019-01-02 — End: 2019-01-02

## 2019-01-03 ENCOUNTER — Telehealth: Payer: Self-pay | Admitting: Family Medicine

## 2019-01-03 NOTE — Telephone Encounter (Signed)
LM with wife to discuss need for apt

## 2019-01-03 NOTE — Telephone Encounter (Signed)
Patient just had brain surgery they want to know why he needs to come in. Please advise.

## 2019-01-03 NOTE — Telephone Encounter (Signed)
Pt wife aware that we will cancel appt - he follows up with surgeon tomorrow

## 2019-01-04 ENCOUNTER — Ambulatory Visit: Payer: 59 | Admitting: Family Medicine

## 2019-01-16 ENCOUNTER — Other Ambulatory Visit: Payer: Self-pay

## 2019-01-16 ENCOUNTER — Ambulatory Visit (INDEPENDENT_AMBULATORY_CARE_PROVIDER_SITE_OTHER): Payer: 59 | Admitting: Family Medicine

## 2019-01-16 ENCOUNTER — Encounter: Payer: Self-pay | Admitting: Family Medicine

## 2019-01-16 DIAGNOSIS — M542 Cervicalgia: Secondary | ICD-10-CM

## 2019-01-16 DIAGNOSIS — N400 Enlarged prostate without lower urinary tract symptoms: Secondary | ICD-10-CM

## 2019-01-16 DIAGNOSIS — J301 Allergic rhinitis due to pollen: Secondary | ICD-10-CM

## 2019-01-16 DIAGNOSIS — E782 Mixed hyperlipidemia: Secondary | ICD-10-CM | POA: Diagnosis not present

## 2019-01-16 DIAGNOSIS — D333 Benign neoplasm of cranial nerves: Secondary | ICD-10-CM | POA: Diagnosis not present

## 2019-01-16 DIAGNOSIS — E559 Vitamin D deficiency, unspecified: Secondary | ICD-10-CM | POA: Diagnosis not present

## 2019-01-16 DIAGNOSIS — Z8719 Personal history of other diseases of the digestive system: Secondary | ICD-10-CM

## 2019-01-16 NOTE — Progress Notes (Signed)
Virtual Visit Via telephone Note I connected with@ on 01/16/19 by telephone and verified that I am speaking with the correct person or authorized healthcare agent using two identifiers. Chris Ali is currently located at home and there are no unauthorized people in close proximity. I completed this visit while in a private location in my home .  The patient's wife was present during the visit.  This visit type was conducted due to national recommendations for restrictions regarding the COVID-19 Pandemic (e.g. social distancing).  This format is felt to be most appropriate for this patient at this time.  All issues noted in this document were discussed and addressed.  No physical exam was performed.    I discussed the limitations, risks, security and privacy concerns of performing an evaluation and management service by telephone and the availability of in person appointments. I also discussed with the patient that there may be a patient responsible charge related to this service. The patient expressed understanding and agreed to proceed.   Date:  01/16/2019    ID:  Chris Ali      1961/01/31        [id number]   Patient Care Team Patient Care Team: Chipper Herb, MD as PCP - General (Family Medicine)  Reason for Visit: Primary Care Follow-up     History of Present Illness & Review of Systems:     Chris Ali is a 58 y.o. year old male primary care patient that presents today for a telehealth visit.  The patient is alert and doing well.  He is status post surgery for a right vestibular schwannoma by Dr. Redmond Pulling at [facility].  He was sent there by Dr. Hermina Barters who is now a faculty member of [facility].  Patient today denies any chest pain pressure tightness or shortness of breath.  He has some swallowing issues in the hospital but is back to normal with liquids and solids now.  He denies any nausea  vomiting diarrhea blood in the stool or black tarry bowel movements.  He is passing his water well.  He does have some constipation it is taking a stool softener for this.  Still has facial weakness with inability to fully close the right eye and is using tears and an antibiotic lubricant for the time being.  He also has a facial weakness on the right side.  This may be due to some damage to the facial nerve and he is hoping this will improve with time.  He says it may be a little bit better with his exercises.  When he was in the hospital he did have some problems with swallowing and he says this may have been due to fentanyl so we will add that as a possible allergy.  He was started on a stool softener and lisinopril 10 mg once daily while in the hospital.  Review of systems as stated, otherwise negative.  The patient does not have symptoms concerning for COVID-19 infection (fever, chills, cough, or new shortness of breath).      Current Medications (Verified) Allergies as of 01/16/2019   No Known Allergies     Medication List       Accurate as of January 16, 2019  7:57 AM. If you have any questions, ask your nurse or doctor.        atorvastatin 20 MG tablet Commonly known as:  LIPITOR Take 1  tablet (20 mg total) by mouth daily.   doxycycline 100 MG tablet Commonly known as:  VIBRA-TABS Take 1 tablet (100 mg total) by mouth 2 (two) times daily. 1 po bid   fluticasone 50 MCG/ACT nasal spray Commonly known as:  FLONASE Place 2 sprays into both nostrils daily.   levocetirizine 5 MG tablet Commonly known as:  XYZAL Take 5 mg by mouth every evening.   Vitamin D (Ergocalciferol) 1.25 MG (50000 UT) Caps capsule Commonly known as:  DRISDOL Take 1 capsule (50,000 Units total) by mouth every 7 (seven) days.           Allergies (Verified)    Patient has no known allergies.  Past Medical History Past Medical History:  Diagnosis Date   Allergy    Hyperlipidemia    Kidney stone      Neck pain    Tinnitus      Past Surgical History:  Procedure Laterality Date   OPEN REPAIR PERIARTICULAR FRACTURE / DISLOCATION ELBOW Left 2004    Social History   Socioeconomic History   Marital status: Married    Spouse name: Not on file   Number of children: Not on file   Years of education: Not on file   Highest education level: Not on file  Occupational History   Not on file  Social Needs   Financial resource strain: Not on file   Food insecurity:    Worry: Not on file    Inability: Not on file   Transportation needs:    Medical: Not on file    Non-medical: Not on file  Tobacco Use   Smoking status: Former Smoker    Types: Cigarettes    Last attempt to quit: 02/04/1988    Years since quitting: 30.9   Smokeless tobacco: Former Systems developer    Quit date: 08/17/1988  Substance and Sexual Activity   Alcohol use: Yes    Comment: rare   Drug use: No   Sexual activity: Not on file  Lifestyle   Physical activity:    Days per week: Not on file    Minutes per session: Not on file   Stress: Not on file  Relationships   Social connections:    Talks on phone: Not on file    Gets together: Not on file    Attends religious service: Not on file    Active member of club or organization: Not on file    Attends meetings of clubs or organizations: Not on file    Relationship status: Not on file  Other Topics Concern   Not on file  Social History Narrative   Not on file     Family History  Problem Relation Age of Onset   Hypertension Mother    Cancer Father        prostate   Cancer Sister        breast   Heart disease Maternal Grandmother    Heart attack Maternal Grandmother 51   Heart disease Maternal Grandfather    Cancer Paternal Grandfather        colon   Pancreatic cancer Paternal Grandfather    Esophageal cancer Neg Hx    Rectal cancer Neg Hx    Stomach cancer Neg Hx       Labs/Other Tests and Data Reviewed:    Wt Readings  from Last 3 Encounters:  01/14/18 173 lb (78.5 kg)  10/25/17 178 lb (80.7 kg)  06/30/17 180 lb 9 oz (81.9 kg)  Temp Readings from Last 3 Encounters:  01/14/18 97.7 F (36.5 C) (Oral)  10/25/17 99.5 F (37.5 C) (Oral)  06/30/17 97.7 F (36.5 C) (Oral)   BP Readings from Last 3 Encounters:  01/14/18 111/70  10/25/17 136/88  06/30/17 (!) 152/86   Pulse Readings from Last 3 Encounters:  01/14/18 78  10/25/17 94  06/30/17 82     No results found for: HGBA1C Lab Results  Component Value Date   LDLCALC 142 (H) 01/14/2018   CREATININE 1.01 01/14/2018       Chemistry      Component Value Date/Time   NA 142 01/14/2018 1216   K 4.1 01/14/2018 1216   CL 103 01/14/2018 1216   CO2 22 01/14/2018 1216   BUN 12 01/14/2018 1216   CREATININE 1.01 01/14/2018 1216      Component Value Date/Time   CALCIUM 9.2 01/14/2018 1216   ALKPHOS 59 01/14/2018 1216   AST 29 01/14/2018 1216   ALT 27 01/14/2018 1216   BILITOT 0.6 01/14/2018 1216         OBSERVATIONS/ OBJECTIVE:     The patient is fully alert and indicates that since being at home he is continued to have issues with the right side of his face and facial asymmetry secondary to the recent surgery he has had.  He has a return appointment with Dr. Redmond Pulling on June 15.  His blood pressures at home of been running anywhere from 1 20-1 31 over the 70s.  His pulse is 70-80.  His weight is about 163 pounds and this is down from 170 that he was previously.  He will continue to work on his diet and practice facial exercises.  He understands that he will need an appointment for routine lab work including a PSA rectal exam and vitamin D level in 6 to 8 weeks.  The nurse will call him with this appointment with a new provider here in the practice.  Physical exam deferred due to nature of telephonic visit.  ASSESSMENT & PLAN    Time:   Today, I have spent 22 minutes with the patient via telephone discussing the above including Covid  precautions.     Visit Diagnoses: 1. Vitamin D deficiency -Has not been taking his vitamin D regularly and will get started back on this which is 50,000 units weekly.  2. Seasonal allergic rhinitis due to pollen -Doing fairly well with this so far.  Continue with Flonase and Xyzal.  3. Unilateral vestibular schwannoma (Man) -Follow-up with neurosurgery as planned  4. Mixed hyperlipidemia -Continue aggressive therapeutic lifestyle changes and atorvastatin and all efforts to lose weight through diet and exercise as tolerated  5. Cervicalgia -No complaints with this today  6. Benign prostatic hyperplasia, unspecified whether lower urinary tract symptoms present -Patient will need blood work including PSA rectal exam and vitamin D in 6 to 8 weeks  7. Hiatal hernia -Continue with omeprazole and diet of avoidance  Patient Instructions  Follow-up with Dr. Redmond Pulling, the neurosurgeon as planned Follow-up with Dr. Thornell Mule as planned Continue to drink plenty of fluids and stay well-hydrated Continue to practice good hand and respiratory hygiene Increase activity as determined by surgeon Continue with facial exercises Resume vitamin D therapy Increase activity level but as recommended by surgeon     The above assessment and management plan was discussed with the patient. The patient verbalized understanding of and has agreed to the management plan. Patient is aware to call the clinic if symptoms persist  or worsen. Patient is aware when to return to the clinic for a follow-up visit. Patient educated on when it is appropriate to go to the emergency department.    Chipper Herb, MD [address], [place], [place] [address] Ph [phone]   Arrie Senate MD

## 2019-01-16 NOTE — Addendum Note (Signed)
Addended by: Zannie Cove on: 01/16/2019 01:46 PM   Modules accepted: Orders

## 2019-01-16 NOTE — Patient Instructions (Addendum)
Follow-up with Dr. Redmond Pulling, the neurosurgeon as planned Follow-up with Dr. Thornell Mule as planned Continue to drink plenty of fluids and stay well-hydrated Continue to practice good hand and respiratory hygiene Increase activity as determined by surgeon Continue with facial exercises Resume vitamin D therapy Increase activity level but as recommended by surgeon Blood pressures regularly and watch sodium intake

## 2019-01-30 DIAGNOSIS — Z86018 Personal history of other benign neoplasm: Secondary | ICD-10-CM | POA: Insufficient documentation

## 2019-02-01 ENCOUNTER — Ambulatory Visit (HOSPITAL_COMMUNITY): Payer: 59 | Attending: Family Medicine

## 2019-02-01 ENCOUNTER — Other Ambulatory Visit: Payer: Self-pay

## 2019-02-01 ENCOUNTER — Encounter (HOSPITAL_COMMUNITY): Payer: Self-pay

## 2019-02-01 DIAGNOSIS — R42 Dizziness and giddiness: Secondary | ICD-10-CM

## 2019-02-01 DIAGNOSIS — R2681 Unsteadiness on feet: Secondary | ICD-10-CM | POA: Diagnosis present

## 2019-02-01 NOTE — Therapy (Signed)
[facility] [address] [place], [place], [address] Phone: [phone]   Fax:  [phone]  Physical Therapy Evaluation  Patient Details  Name: Chris Ali MRN: [id number] Date of Birth: 1961-01-01 Referring Provider (PT): Elliot Gault, MD   Encounter Date: 02/01/2019  PT End of Session - 02/01/19 1846    Visit Number  1    Number of Visits  6    Date for PT Re-Evaluation  02/22/19    Authorization Type  [employer]    Authorization Time Period  02/01/19 to 02/22/19    Authorization - Visit Number  1    Authorization - Number of Visits  90   PT/OT combined, HARD max; 4 modalities/day   PT Start Time  1312    PT Stop Time  1352    PT Time Calculation (min)  40 min    Equipment Utilized During Treatment  Gait belt    Activity Tolerance  Patient tolerated treatment well    Behavior During Therapy  [facility] for tasks assessed/performed       Past Medical History:  Diagnosis Date  . Allergy   . Hyperlipidemia   . Kidney stone   . Neck pain   . Tinnitus     Past Surgical History:  Procedure Laterality Date  . OPEN REPAIR PERIARTICULAR FRACTURE / DISLOCATION ELBOW Left 2004    There were no vitals filed for this visit.   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 He started having some facial numbness as well and had an MRI which showed an acoustic neuroma. He had it removed on 12/28/18 (5weeks PO). He states that he has some facial paralysis folloiwng his surgery and he states that the MD couldn't find the facial nerve and thinks they damaged it. He is also noticing some issues with his balance since the surgery. He also reports some dizziness since the surgery; the dizziness with sitting up from lying down and some intermittent dizziness with head turns in walking. Doesn't think he has issues  with rolling R/L in bed. Dizziness symptoms can last maybe 15-20 seconds when he sits up from lying down. He is on BP medication and states that it has a dizziness side effect; notices that he is better when he doesn't take it. Not sure how long he will be on it. His BP was typically well-managed (120s/80s) but when he was in the hospital, it went up very high and they had to put him on the medication to control that. No falls or close calls, just general unsteadiness.    Patient Stated Goals  get where he can function again    Currently in Pain?  No/denies         [facility] PT Assessment - 02/01/19 0001      Assessment   Medical Diagnosis  Vertigo    Referring Provider (PT)  Elliot Gault, MD    Onset Date/Surgical Date  12/28/18    Next MD Visit  April 06, 2019 (MRI and MD f/u)    Prior Therapy  none for present issue      Balance Screen   Has the patient fallen in the past 6 months  No    Has the patient had a decrease in activity level because of a fear of falling?  No    Is the patient reluctant to leave their home because of a fear of falling?   No      Prior Function   Level of Independence  Independent    Vocation  Full time employment   out until 03/01/19   Vocation Requirements  Data center technique, electrician    Leisure  kayak, projects around the house      Observation/Other Assessments   Focus on Therapeutic Outcomes (FOTO)   to be completed next visit      Balance   Balance Assessed  Yes      Static Standing Balance   Static Standing - Balance Support  No upper extremity supported    Static Standing Balance -  Activities   Single Leg Stance - Right Leg;Single Leg Stance - Left Leg;Romberg - Eyes Opened;Sharpened Romberg - Eyes Open;Sharpened Romberg - Eyes Closed;Sharpened Romberg - Eyes Open, Foam    Static Standing - Comment/# of Minutes  R:18 sec L:39 sec; Sharpened Romberg (tandem) EO firm -- 30sec BLE;  Sharpened Romberg EC firm: 3.5sec or < BLE;  Sharpened  Romberg (tandem) EO foam -- RLE back: 25sec on R, 13 sec on L;  Sharpened Romberg EC foam -- RLE back 4sec or <, LLE back 3sec or <      Standardized Balance Assessment   Standardized Balance Assessment  Dynamic Gait Index      Dynamic Gait Index   Level Surface  Normal    Change in Gait Speed  Mild Impairment    Gait with Horizontal Head Turns  Mild Impairment    Gait with Vertical Head Turns  Normal    Gait and Pivot Turn  Normal    Step Over Obstacle  Normal    Step Around Obstacles  Normal    Steps  Normal    Total Score  22      Functional Gait  Assessment   Gait assessed   Yes    Gait Level Surface  Walks 20 ft in less than 5.5 sec, no assistive devices, good speed, no evidence for imbalance, normal gait pattern, deviates no more than 6 in outside of the 12 in walkway width.    Change in Gait Speed  Able to change speed, demonstrates mild gait deviations, deviates 6-10 in outside of the 12 in walkway width, or no gait deviations, unable to achieve a major change in velocity, or uses a change in velocity, or uses an assistive device.    Gait with Horizontal Head Turns  Performs head turns smoothly with slight change in gait velocity (eg, minor disruption to smooth gait path), deviates 6-10 in outside 12 in walkway width, or uses an assistive device.    Gait with Vertical Head Turns  Performs head turns with no change in gait. Deviates no more than 6 in outside 12 in walkway width.    Gait and Pivot Turn  Pivot turns safely within 3 sec and stops quickly with no loss of balance.    Step Over Obstacle  Is able to step over 2 stacked shoe boxes taped together (9 in total height) without changing gait speed. No evidence of imbalance.    Gait with Narrow Base of Support  Is able to ambulate for 10 steps heel to toe with no staggering.    Gait with Eyes Closed  Walks 20 ft, slow speed, abnormal gait pattern, evidence for imbalance, deviates 10-15 in outside 12 in walkway width. Requires more  than  9 sec to ambulate 20 ft.    Ambulating Backwards  Walks 20 ft, uses assistive device, slower speed, mild gait deviations, deviates 6-10 in outside 12 in walkway width.    Steps  Alternating feet, no rail.    Total Score  25           Vestibular Assessment - 02/01/19 0001      Oculomotor Exam   Head shaking Horizontal  --   lost focus when shaking to the R   Smooth Pursuits  Intact    Saccades  Intact      Positional Testing   Dix-Hallpike  Dix-Hallpike Right;Dix-Hallpike Left    Horizontal Canal Testing  Horizontal Canal Right;Horizontal Canal Left      Dix-Hallpike Right   Dix-Hallpike Right Duration  negative      Dix-Hallpike Left   Dix-Hallpike Left Duration  negative      Horizontal Canal Right   Horizontal Canal Right Duration  negative      Horizontal Canal Left   Horizontal Canal Left Duration  negative          Objective measurements completed on examination: See above findings.        PT Education - 02/01/19 1846    Education Details  exam findings, HEP, POC    Person(s) Educated  Patient    Methods  Explanation;Demonstration;Handout    Comprehension  Verbalized understanding;Returned demonstration       PT Short Term Goals - 02/01/19 1856      PT SHORT TERM GOAL #1   Title  Patient will have improved FGA score by 3 points to 28/30. In order to demo improved dynamic balance with walking.    Time  2    Period  Weeks    Status  New    Target Date  02/15/19      PT SHORT TERM GOAL #2   Title  Patient will have improved SLS to 15 seconds or > on BLE to demo improved balance and maximize gait.    Time  2    Period  Weeks    Status  New        PT Long Term Goals - 02/01/19 1859      PT LONG TERM GOAL #1   Title  Patient will have improved tandem stance on foam with EO and EC for 12 seconds or > to demo improved overall balance and vestibular function.    Time  3    Period  Weeks    Status  New    Target Date  02/15/19      PT  LONG TERM GOAL #2   Title  Patient will report reduced overall dizziness to minimal to none with transitional movements and during head turns with gait to demo improved overall vestibular ocular nerve function and maximize return to PLOF.    Time  3    Period  Weeks    Status  New             Plan - 02/01/19 1851    Clinical Impression Statement  Pt is pleasant 58YO M who presents to OPPT s/p acoustic neuroma excision on 12/28/18 by Dr. Redmond Pulling. Pt currently presents with deficits in static and dynamic balance. He demo'd difficulty with tandem stance and EC both on firm ground and foam, indicating vestibular/inner ear hypofunction. Pt scored 22/24 on the DGI and 25/30 on the FGA, indicating that he is at low risk for falls, however again,  he demo'd the most difficulty with the tasks that challenged inner ear. Pt negative for BPPV testing; he had mild symptoms but not significant and no nystagmus. Feel pt's imbalance is due to vestibulo-ocular nerve from the schwannoma and subsequent surgery. Pt needs skilled PT intervention to address his balance deficits in order to maximize return to PLOF.    Personal Factors and Comorbidities  Comorbidity 1    Examination-Activity Limitations  Locomotion Level    Examination-Participation Restrictions  Community Activity;[employer] Work;Other   work   Merchant [employer] officer  Stable/Uncomplicated    Designer, jewellery  Low    Rehab Potential  Good    PT Frequency  2x / week    PT Duration  3 weeks    PT Treatment/Interventions  ADLs/Self Care Home Management;Canalith Repostioning;DME Instruction;Gait training;Stair training;Functional mobility training;Therapeutic activities;Therapeutic exercise;Balance training;Neuromuscular re-education;Patient/family education;Orthotic Fit/Training;Manual techniques;Passive range of motion;Taping;Vestibular    PT Next Visit Plan  Review goals, initiate static and dynamic balance training EO/EC on firm and  foam/unstable surfaces, progressing as able; functional strengthening PRN    PT Home Exercise Plan  eval: SLS, tandem stance EO/EC firm surface in corner    Consulted and Agree with Plan of Care  Patient       Patient will benefit from skilled therapeutic intervention in order to improve the following deficits and impairments:  Decreased balance, Dizziness, Difficulty walking, Impaired vision/preception, Decreased mobility  Visit Diagnosis: 1. Dizziness and giddiness   2. Unsteadiness on feet        Problem List Patient Active Problem List   Diagnosis Date Noted  . Allergic rhinitis 12/21/2013  . Hyperlipidemia 12/21/2013  . Nephrolithiasis 12/21/2013  . Vitamin D deficiency 12/21/2013  . Hiatal hernia 12/21/2013  . Tinnitus of right ear 02/03/2013       Geraldine Solar PT, DPT   [facility] [address] [place], [place], [address] Phone: [phone]   Fax:  [phone]  Name: Chris Ali MRN: [id number] Date of Birth: 06-Nov-1960

## 2019-02-02 ENCOUNTER — Telehealth: Payer: Self-pay | Admitting: Family Medicine

## 2019-02-02 NOTE — Telephone Encounter (Signed)
His surgeon suggested appointment with PCP.   He will call Monday to speak with his pcp's nurse and discuss new provider and other problems.

## 2019-02-06 ENCOUNTER — Other Ambulatory Visit: Payer: Self-pay

## 2019-02-06 ENCOUNTER — Ambulatory Visit (HOSPITAL_COMMUNITY): Payer: 59

## 2019-02-06 ENCOUNTER — Encounter (HOSPITAL_COMMUNITY): Payer: Self-pay

## 2019-02-06 DIAGNOSIS — R2681 Unsteadiness on feet: Secondary | ICD-10-CM

## 2019-02-06 DIAGNOSIS — R42 Dizziness and giddiness: Secondary | ICD-10-CM | POA: Diagnosis not present

## 2019-02-06 NOTE — Therapy (Signed)
[facility] [place], [place], [address] Phone: [phone]   Fax:  [phone]  Physical Therapy Treatment  Patient Details  Name: Chris Ali MRN: [id number] Date of Birth: [birth year] Referring Provider (PT): Elliot Gault, MD   Encounter Date: 02/06/2019  PT End of Session - 02/06/19 1030    Visit Number  2    Number of Visits  6    Date for PT Re-Evaluation  02/22/19    Authorization Type  UHC    Authorization Time Period  02/01/19 to 02/22/19    Authorization - Visit Number  2    Authorization - Number of Visits  90   PT/OT combined, HARD max; 4 modalities/day   PT Start Time  1008    PT Stop Time  1046    PT Time Calculation (min)  38 min    Equipment Utilized During Treatment  Gait belt    Activity Tolerance  Patient tolerated treatment well    Behavior During Therapy  WFL for tasks assessed/performed       Past Medical History:  Diagnosis Date  . Allergy   . Hyperlipidemia   . Kidney stone   . Neck pain   . Tinnitus     Past Surgical History:  Procedure Laterality Date  . OPEN REPAIR PERIARTICULAR FRACTURE / DISLOCATION ELBOW Left 2004    There were no vitals filed for this visit.  Subjective Assessment - 02/06/19 1009    Subjective  Pt stated he has began balance activites at home, no reports of questions concerning new exercises or reports of recent fall.    Patient Stated Goals  get where he can function again    Currently in Pain?  No/denies                            Balance Exercises - 02/06/19 1027      Balance Exercises: Standing   Tandem Stance  Eyes open;Eyes closed;5 reps;10 secs;30 secs;Other (comment)   static tandem x 30" EO, EC 10" holds solid then foam; head   SLS  Eyes open;3 reps   Rt 29"; Lt 34" max of 3   SLS with Vectors  2 reps;10 secs;Solid surface   no HHA   Sit to Stand Time  STS with laser over postit on wall 10x        PT Education -  02/06/19 1055    Education Details  Reviewed goals, assured compliance with HEP    Person(s) Educated  Patient    Methods  Explanation    Comprehension  Verbalized understanding;Returned demonstration       PT Short Term Goals - 02/01/19 1856      PT SHORT TERM GOAL #1   Title  Patient will have improved FGA score by 3 points to 28/30. In order to demo improved dynamic balance with walking.    Time  2    Period  Weeks    Status  New    Target Date  02/15/19      PT SHORT TERM GOAL #2   Title  Patient will have improved SLS to 15 seconds or > on BLE to demo improved balance and maximize gait.    Time  2    Period  Weeks    Status  New        PT Long Term Goals - 02/01/19 1859  PT LONG TERM GOAL #1   Title  Patient will have improved tandem stance on foam with EO and EC for 12 seconds or > to demo improved overall balance and vestibular function.    Time  3    Period  Weeks    Status  New    Target Date  02/15/19      PT LONG TERM GOAL #2   Title  Patient will report reduced overall dizziness to minimal to none with transitional movements and during head turns with gait to demo improved overall vestibular ocular nerve function and maximize return to PLOF.    Time  3    Period  Weeks    Status  New            Plan - 02/06/19 1006    Clinical Impression Statement  Reviewed goals and compliance with HEP.  Began FOTO, questions pertained towards post-CVA so stopped questions as does not apply to pt.  Session focus on static balance activities with EO/EC and solid/foam surfaces activities.  Improved abilities with BLE SLS with EO.  Pt presents wiht increased difficulty with weight bearing Lt LE and EC activities requiring min A for safety.    Personal Factors and Comorbidities  Comorbidity 1    Examination-Activity Limitations  Locomotion Level    Examination-Participation Restrictions  Community Activity;Yard Work;Other    Stability/Clinical Decision Making   Stable/Uncomplicated    Clinical Decision Making  Low    Rehab Potential  Good    PT Frequency  2x / week    PT Duration  3 weeks    PT Treatment/Interventions  ADLs/Self Care Home Management;Canalith Repostioning;DME Instruction;Gait training;Stair training;Functional mobility training;Therapeutic activities;Therapeutic exercise;Balance training;Neuromuscular re-education;Patient/family education;Orthotic Fit/Training;Manual techniques;Passive range of motion;Taping;Vestibular    PT Next Visit Plan  Continue static and dynamic balance training EO/EC on firm and foam/unstable surfaces, progressing as able; functional strengthening PRN    PT Home Exercise Plan  eval: SLS, tandem stance EO/EC firm surface in corner       Patient will benefit from skilled therapeutic intervention in order to improve the following deficits and impairments:  Decreased balance, Dizziness, Difficulty walking, Impaired vision/preception, Decreased mobility  Visit Diagnosis: 1. Dizziness and giddiness   2. Unsteadiness on feet        Problem List Patient Active Problem List   Diagnosis Date Noted  . Allergic rhinitis 12/21/2013  . Hyperlipidemia 12/21/2013  . Nephrolithiasis 12/21/2013  . Vitamin D deficiency 12/21/2013  . Hiatal hernia 12/21/2013  . Tinnitus of right ear 02/03/2013   Ihor Austin, LPTA; [place]  Aldona Lento 02/06/2019, 10:56 AM  [facility] [place], [place], [address] Phone: [phone]   Fax:  [phone]  Name: ANTOINO WESTHOFF MRN: [id number] Date of Birth: Nov 19, 1960

## 2019-02-09 ENCOUNTER — Encounter (HOSPITAL_COMMUNITY): Payer: Self-pay

## 2019-02-09 ENCOUNTER — Other Ambulatory Visit: Payer: Self-pay

## 2019-02-09 ENCOUNTER — Ambulatory Visit (HOSPITAL_COMMUNITY): Payer: 59

## 2019-02-09 DIAGNOSIS — R42 Dizziness and giddiness: Secondary | ICD-10-CM

## 2019-02-09 DIAGNOSIS — R2681 Unsteadiness on feet: Secondary | ICD-10-CM

## 2019-02-09 NOTE — Therapy (Signed)
[facility] [place], [place], [address] Phone: [phone]   Fax:  [phone]  Physical Therapy Treatment  Patient Details  Name: Chris Ali MRN: [id number] Date of Birth: March 22, 1961 Referring Provider (PT): Elliot Gault, MD   Encounter Date: 02/09/2019  PT End of Session - 02/09/19 1353    Visit Number  3    Number of Visits  6    Date for PT Re-Evaluation  02/22/19    Authorization Type  UHC    Authorization Time Period  02/01/19 to 02/22/19    Authorization - Visit Number  3    Authorization - Number of Visits  90   PT/OT combined, HARD max; 4 modalities/day   PT Start Time  1302    PT Stop Time  1346    PT Time Calculation (min)  44 min    Equipment Utilized During Treatment  Gait belt    Activity Tolerance  Patient tolerated treatment well    Behavior During Therapy  WFL for tasks assessed/performed       Past Medical History:  Diagnosis Date  . Allergy   . Hyperlipidemia   . Kidney stone   . Neck pain   . Tinnitus     Past Surgical History:  Procedure Laterality Date  . OPEN REPAIR PERIARTICULAR FRACTURE / DISLOCATION ELBOW Left 2004    There were no vitals filed for this visit.  Subjective Assessment - 02/09/19 1303    Subjective  Pt reports increased dizziness starting Tuesday night, stated he feels he over did it.  Has been heavy lifting around the house, feels may be related.    Patient Stated Goals  get where he can function again    Currently in Pain?  No/denies                            Balance Exercises - 02/09/19 1311      Balance Exercises: Standing   Standing Eyes Closed  2 reps;30 secs;Narrow base of support (BOS)   with perturbation all directions   Tandem Stance  Eyes open;Foam/compliant surface;1 rep;30 secs;Eyes closed;10 secs   EC: max of 7" holds prior LOB   SLS  Eyes open;Eyes closed    SLS with Vectors  Solid surface;3 reps;15 secs   BLE no HHA    Balance Master: Limits for Stability  Test: L8 A 99%    Balance Master: Dynamic  Test: L4 2:40    Gait with Head Turns  Forward;Retro;3 reps   tandem, retro and sidestep with GTB head turns   Tandem Gait  Forward;Retro;2 reps   first with head turns then eyes closed   Retro Gait  Head turns;2 reps   first with head turns then eyes closed   Sidestepping  Head turns;2 reps;Theraband   GTB   Sit to Stand Time  STS with laser over postit on wall 10x          PT Short Term Goals - 02/01/19 1856      PT SHORT TERM GOAL #1   Title  Patient will have improved FGA score by 3 points to 28/30. In order to demo improved dynamic balance with walking.    Time  2    Period  Weeks    Status  New    Target Date  02/15/19      PT SHORT TERM GOAL #2   Title  Patient will have improved SLS to 15 seconds or > on BLE to demo improved balance and maximize gait.    Time  2    Period  Weeks    Status  New        PT Long Term Goals - 02/01/19 1859      PT LONG TERM GOAL #1   Title  Patient will have improved tandem stance on foam with EO and EC for 12 seconds or > to demo improved overall balance and vestibular function.    Time  3    Period  Weeks    Status  New    Target Date  02/15/19      PT LONG TERM GOAL #2   Title  Patient will report reduced overall dizziness to minimal to none with transitional movements and during head turns with gait to demo improved overall vestibular ocular nerve function and maximize return to PLOF.    Time  3    Period  Weeks    Status  New            Plan - 02/09/19 1354    Clinical Impression Statement  Progressed balance activities with NBOS paired with head turns or EC with increased diffuclty.  Also began dynamic balance gait activities including tandem, retro and sidestepping paired with head turns and EC.  Min A for safety with activities.  Pt improving SLS with EO, encouraged to begin SLS with EC at home in front of counter/sink for safety,  verbalized understanding.    Personal Factors and Comorbidities  Comorbidity 1    Examination-Activity Limitations  Locomotion Level    Examination-Participation Restrictions  Community Activity;Yard Work;Other    Clinical Decision Making  Low    Rehab Potential  Good    PT Frequency  2x / week    PT Duration  3 weeks    PT Treatment/Interventions  ADLs/Self Care Home Management;Canalith Repostioning;DME Instruction;Gait training;Stair training;Functional mobility training;Therapeutic activities;Therapeutic exercise;Balance training;Neuromuscular re-education;Patient/family education;Orthotic Fit/Training;Manual techniques;Passive range of motion;Taping;Vestibular    PT Next Visit Plan  Progress to balance activities in dark room as well as balance beam.  Continue static and dynamic balance training EO/EC on firm and foam/unstable surfaces, progressing as able; functional strengthening PRN    PT Home Exercise Plan  eval: SLS, tandem stance EO/EC firm surface in corner; 06/25: SLS with EC       Patient will benefit from skilled therapeutic intervention in order to improve the following deficits and impairments:  Decreased balance, Dizziness, Difficulty walking, Impaired vision/preception, Decreased mobility  Visit Diagnosis: 1. Dizziness and giddiness   2. Unsteadiness on feet        Problem List Patient Active Problem List   Diagnosis Date Noted  . Allergic rhinitis 12/21/2013  . Hyperlipidemia 12/21/2013  . Nephrolithiasis 12/21/2013  . Vitamin D deficiency 12/21/2013  . Hiatal hernia 12/21/2013  . Tinnitus of right ear 02/03/2013   Ihor Austin, [employer]; [place]  Aldona Lento 02/09/2019, 1:59 PM  [facility] [place], [place], [address] Phone: [phone]   Fax:  [phone]  Name: Chris Ali MRN: [id number] Date of Birth: May 18, 1961

## 2019-02-14 ENCOUNTER — Other Ambulatory Visit: Payer: Self-pay

## 2019-02-14 ENCOUNTER — Other Ambulatory Visit: Payer: Self-pay | Admitting: Family Medicine

## 2019-02-14 ENCOUNTER — Encounter (HOSPITAL_COMMUNITY): Payer: Self-pay

## 2019-02-14 ENCOUNTER — Ambulatory Visit (HOSPITAL_COMMUNITY): Payer: 59

## 2019-02-14 DIAGNOSIS — R42 Dizziness and giddiness: Secondary | ICD-10-CM

## 2019-02-14 DIAGNOSIS — R2681 Unsteadiness on feet: Secondary | ICD-10-CM

## 2019-02-14 NOTE — Therapy (Addendum)
[place], [place], [address] Phone: [phone]   Fax:  [phone]  Physical Therapy Treatment  Patient Details  Name: Chris Ali MRN: [id number] Date of Birth: 1961/07/13 Referring Provider (PT): Elliot Gault, MD   Encounter Date: 02/14/2019  PT End of Session - 02/14/19 0945    Visit Number  4    Number of Visits  6    Date for PT Re-Evaluation  02/22/19    Authorization Type  [employer]    Authorization Time Period  02/01/19 to 02/22/19    Authorization - Visit Number  4    Authorization - Number of Visits  90   PT/OT combined, HARD max; 4 modalities/day   PT Start Time  0904    PT Stop Time  0945    PT Time Calculation (min)  41 min    Equipment Utilized During Treatment  Gait belt    Activity Tolerance  Patient tolerated treatment well    Behavior During Therapy  [facility] for tasks assessed/performed       Past Medical History:  Diagnosis Date  . Allergy   . Hyperlipidemia   . Kidney stone   . Neck pain   . Tinnitus     Past Surgical History:  Procedure Laterality Date  . OPEN REPAIR PERIARTICULAR FRACTURE / DISLOCATION ELBOW Left 2004    There were no vitals filed for this visit.  Subjective Assessment - 02/14/19 0906    Subjective  Pt stated he has good and bad days , Sunday was a bad day as far as dizziness.  Was able to carry 100# pipes through woods following last session, reports increased confidence with balance and tasks.  Reports recently stopping BP medicine.    Patient Stated Goals  get where he can function again    Currently in Pain?  No/denies                            Balance Exercises - 02/14/19 0918      Balance Exercises: Standing   Standing Eyes Closed  2 reps;30 secs;Narrow base of support (BOS)    Tandem Stance  Eyes open;Eyes closed;Foam/compliant surface;2 reps;30 secs   pallof 4x 15 on foam   SLS  Eyes open;3 reps;Eyes closed   EO: Lt 44" Rt 60";  EC: BLE 5"   SLS with Vectors  Solid surface;3 reps;15 secs   no HHA   Balance Beam  Tandem and sidestep 2nd set with head turns    Gait with Head Turns  Forward;Retro;3 reps    Retro Gait  Head turns;1 rep    Sidestepping  Head turns;Theraband;1 rep    Sit to Stand Time  STS with laser over postit on wall 10x          PT Short Term Goals - 02/01/19 1856      PT SHORT TERM GOAL #1   Title  Patient will have improved FGA score by 3 points to 28/30. In order to demo improved dynamic balance with walking.    Time  2    Period  Weeks    Status  New    Target Date  02/15/19      PT SHORT TERM GOAL #2   Title  Patient will have improved SLS to 15 seconds or > on BLE to demo improved balance and maximize gait.    Time  2  Period  Weeks    Status  New        PT Long Term Goals - 02/01/19 1859      PT LONG TERM GOAL #1   Title  Patient will have improved tandem stance on foam with EO and EC for 12 seconds or > to demo improved overall balance and vestibular function.    Time  3    Period  Weeks    Status  New    Target Date  02/15/19      PT LONG TERM GOAL #2   Title  Patient will report reduced overall dizziness to minimal to none with transitional movements and during head turns with gait to demo improved overall vestibular ocular nerve function and maximize return to PLOF.    Time  3    Period  Weeks    Status  New            Plan - 02/14/19 0947    Clinical Impression Statement  Pt reports increased dizziness since laying on ground with grandson and reports increased dizziness looking to the Rt.  Continued session focus with balance training, able to progress to balance beam wiht min A and good return with any LOB episodes.  Increased reports of dizziness especially when looking to the right.  EOS with static exercises to reduce dizziness from 3/5 to 1/5 at EOS.    Personal Factors and Comorbidities  Comorbidity 1    Examination-Activity Limitations  Locomotion  Level    Examination-Participation Restrictions  Community Activity;Yard Work;Other    Stability/Clinical Decision Making  Stable/Uncomplicated    Clinical Decision Making  Low    Rehab Potential  Good    PT Frequency  2x / week    PT Duration  3 weeks    PT Treatment/Interventions  ADLs/Self Care Home Management;Canalith Repostioning;DME Instruction;Gait training;Stair training;Functional mobility training;Therapeutic activities;Therapeutic exercise;Balance training;Neuromuscular re-education;Patient/family education;Orthotic Fit/Training;Manual techniques;Passive range of motion;Taping;Vestibular    PT Next Visit Plan  Check orthostatic vitals next session if continue to have high dizziness.  Progress to balance activities in dark room as well as balance beam.  Continue static and dynamic balance training EO/EC on firm and foam/unstable surfaces, progressing as able; functional strengthening PRN    PT Home Exercise Plan  eval: SLS, tandem stance EO/EC firm surface in corner; 06/25: SLS with EC       Patient will benefit from skilled therapeutic intervention in order to improve the following deficits and impairments:  Decreased balance, Dizziness, Difficulty walking, Impaired vision/preception, Decreased mobility  Visit Diagnosis: 1. Unsteadiness on feet   2. Dizziness and giddiness        Problem List Patient Active Problem List   Diagnosis Date Noted  . Allergic rhinitis 12/21/2013  . Hyperlipidemia 12/21/2013  . Nephrolithiasis 12/21/2013  . Vitamin D deficiency 12/21/2013  . Hiatal hernia 12/21/2013  . Tinnitus of right ear 02/03/2013   Ihor Austin, [place]; [place]  Aldona Lento 02/14/2019, 10:01 AM  [place], [place], [address] Phone: [phone]   Fax:  [phone]  Name: FARRIS GEIMAN MRN: [id number] Date of Birth: 1960-12-03

## 2019-02-15 ENCOUNTER — Encounter (HOSPITAL_COMMUNITY): Payer: Self-pay

## 2019-02-15 ENCOUNTER — Ambulatory Visit (HOSPITAL_COMMUNITY): Payer: 59 | Attending: Family Medicine

## 2019-02-15 VITALS — BP 123/76 | HR 84

## 2019-02-15 DIAGNOSIS — R42 Dizziness and giddiness: Secondary | ICD-10-CM | POA: Diagnosis present

## 2019-02-15 DIAGNOSIS — R2681 Unsteadiness on feet: Secondary | ICD-10-CM | POA: Diagnosis not present

## 2019-02-15 NOTE — Therapy (Signed)
[place], [place], [address] Phone: [phone]   Fax:  [phone]  Physical Therapy Treatment  Patient Details  Name: Chris Ali MRN: [id number] Date of Birth: Feb 23, 1961 Referring Provider (PT): Elliot Gault, MD   Encounter Date: 02/15/2019  PT End of Session - 02/15/19 0949    Visit Number  5    Number of Visits  6    Date for PT Re-Evaluation  02/22/19    Authorization Type  [employer]    Authorization Time Period  02/01/19 to 02/22/19    Authorization - Visit Number  5    Authorization - Number of Visits  54    PT Start Time  0904    PT Stop Time  0946    PT Time Calculation (min)  42 min    Equipment Utilized During Treatment  Gait belt    Activity Tolerance  Patient tolerated treatment well    Behavior During Therapy  [facility] for tasks assessed/performed       Past Medical History:  Diagnosis Date  . Allergy   . Hyperlipidemia   . Kidney stone   . Neck pain   . Tinnitus     Past Surgical History:  Procedure Laterality Date  . OPEN REPAIR PERIARTICULAR FRACTURE / DISLOCATION ELBOW Left 2004    Vitals:   02/15/19 0909 02/15/19 0910 02/15/19 0911  BP: 131/74 125/77 123/76  Pulse: 84      Supine     Sitting   Standing      Subjective Assessment - 02/15/19 0908    Subjective  Pt reports vast improvement with decreased overall dizziness to 2/5 compared to yesterday.                            Balance Exercises - 02/15/19 0920      Balance Exercises: Standing   Tandem Stance  Eyes open;Eyes closed;5 reps;20 secs   Able to tandem stance EC for 18-20" both positions   SLS  Eyes open;Eyes closed;Solid surface   BLE EO: 60"; EC: 5-6" tops   Rockerboard  20 seconds   partial tandem stance to improve weight distribution   Balance Beam  Tandem and sidestep 2nd set with head turns    Gait with Head Turns  Forward;Retro;3 reps          PT Short Term Goals - 02/01/19 1856      PT SHORT TERM GOAL #1   Title  Patient will have improved FGA score by 3 points to 28/30. In order to demo improved dynamic balance with walking.    Time  2    Period  Weeks    Status  New    Target Date  02/15/19      PT SHORT TERM GOAL #2   Title  Patient will have improved SLS to 15 seconds or > on BLE to demo improved balance and maximize gait.    Time  2    Period  Weeks    Status  New        PT Long Term Goals - 02/01/19 1859      PT LONG TERM GOAL #1   Title  Patient will have improved tandem stance on foam with EO and EC for 12 seconds or > to demo improved overall balance and vestibular function.    Time  3    Period  Weeks  Status  New    Target Date  02/15/19      PT LONG TERM GOAL #2   Title  Patient will report reduced overall dizziness to minimal to none with transitional movements and during head turns with gait to demo improved overall vestibular ocular nerve function and maximize return to PLOF.    Time  3    Period  Weeks    Status  New            Plan - 02/15/19 0950    Clinical Impression Statement  Began session with orthostatic hypotension as pt reports increased dizziness after stopping BP medication, pt presents with close measurements each direction- no orthostatic hypotension noted.  Pt making good progress with ability to SLS 60" BLE first attempt today, continues to have difficulty with EC activities.  Today's session focus on finding appropriate weight bearing in NBOS with EC.  Added rockerboard in partial tandem stance to improve weight bearing that did assist with EC activities.  EOS pt reports reduction in dizziness.    Personal Factors and Comorbidities  Comorbidity 1    Examination-Activity Limitations  Locomotion Level    Examination-Participation Restrictions  Community Activity;Yard Work;Other    Stability/Clinical Decision Making  Stable/Uncomplicated    Clinical Decision Making  Low    Rehab Potential  Good    PT Frequency  2x  / week    PT Duration  3 weeks    PT Treatment/Interventions  ADLs/Self Care Home Management;Canalith Repostioning;DME Instruction;Gait training;Stair training;Functional mobility training;Therapeutic activities;Therapeutic exercise;Balance training;Neuromuscular re-education;Patient/family education;Orthotic Fit/Training;Manual techniques;Passive range of motion;Taping;Vestibular    PT Next Visit Plan  Reassess next session.  Continue static and dynamic balance training EO/EC on firm and foam/unstable surfaces, progressing as able; functional strengthening PRN    PT Home Exercise Plan  eval: SLS, tandem stance EO/EC firm surface in corner; 06/25: SLS with EC       Patient will benefit from skilled therapeutic intervention in order to improve the following deficits and impairments:  Decreased balance, Dizziness, Difficulty walking, Impaired vision/preception, Decreased mobility  Visit Diagnosis: 1. Unsteadiness on feet   2. Dizziness and giddiness        Problem List Patient Active Problem List   Diagnosis Date Noted  . Allergic rhinitis 12/21/2013  . Hyperlipidemia 12/21/2013  . Nephrolithiasis 12/21/2013  . Vitamin D deficiency 12/21/2013  . Hiatal hernia 12/21/2013  . Tinnitus of right ear 02/03/2013   Ihor Austin, [place]; [place]  Aldona Lento 02/15/2019, 9:58 AM  [place] [address] [place], [place], [address] Phone: [phone]   Fax:  [phone]  Name: Chris Ali MRN: [id number] Date of Birth: 04-22-1961

## 2019-02-20 ENCOUNTER — Other Ambulatory Visit: Payer: Self-pay

## 2019-02-20 ENCOUNTER — Encounter (HOSPITAL_COMMUNITY): Payer: Self-pay

## 2019-02-20 ENCOUNTER — Ambulatory Visit (HOSPITAL_COMMUNITY): Payer: 59

## 2019-02-20 DIAGNOSIS — R2681 Unsteadiness on feet: Secondary | ICD-10-CM | POA: Diagnosis not present

## 2019-02-20 DIAGNOSIS — R42 Dizziness and giddiness: Secondary | ICD-10-CM

## 2019-02-20 NOTE — Therapy (Signed)
[place], [place], [address] Phone: [phone]   Fax:  [phone]   Progress Note Reporting Period 02/01/19 to 02/20/19  See note below for Objective Data and Assessment of Progress/Goals.       Physical Therapy Treatment  Patient Details  Name: Chris Ali MRN: [id number] Date of Birth: 11/17/1960 Referring Provider (PT): Elliot Gault, MD   Encounter Date: 02/20/2019  PT End of Session - 02/20/19 1124    Visit Number  6    Number of Visits  7    Date for PT Re-Evaluation  03/20/19    Authorization Type  UHC    Authorization Time Period  02/01/19 to 02/22/19; NEW: 02/20/19 to 03/20/19 (4 week HEP POC with 1 f/u after if needed)    Authorization - Visit Number  6    Authorization - Number of Visits  90    PT Start Time  1118    PT Stop Time  1158    PT Time Calculation (min)  40 min    Equipment Utilized During Treatment  Gait belt    Activity Tolerance  Patient tolerated treatment well    Behavior During Therapy  [facility] for tasks assessed/performed       Past Medical History:  Diagnosis Date  . Allergy   . Hyperlipidemia   . Kidney stone   . Neck pain   . Tinnitus     Past Surgical History:  Procedure Laterality Date  . OPEN REPAIR PERIARTICULAR FRACTURE / DISLOCATION ELBOW Left 2004    There were no vitals filed for this visit.  Subjective Assessment - 02/20/19 1118    Subjective  Pt reports feeling pretty good currently. Pt reports Sunday-Tuesday last week with significant balance issues, and since Wednesday has been good. Pt denies any falls last week, just "everything was moving". Pt reports he is having procedure done 7/10 to improve eye function and hopefully returning to work 03/01/19.    Patient Stated Goals  get where he can function again    Currently in Pain?  No/denies         [facility] PT Assessment - 02/20/19 0001      Assessment   Medical Diagnosis  Vertigo    Referring Provider  (PT)  Elliot Gault, MD    Onset Date/Surgical Date  12/28/18    Next MD Visit  April 06, 2019 (MRI and MD f/u)    Prior Therapy  none for present issue      Balance   Balance Assessed  Yes      Static Standing Balance   Static Standing - Balance Support  No upper extremity supported    Static Standing Balance -  Activities   Single Leg Stance - Right Leg;Single Leg Stance - Left Leg;Romberg - Eyes Opened;Sharpened Romberg - Eyes Open;Sharpened Romberg - Eyes Closed;Sharpened Romberg - Eyes Open, Foam    Static Standing - Comment/# of Minutes  R: 15.5 sec L: 41.5 sec; Sharpened Romberg (tandem) EO firm BLE >1 min; Sharpened Romberg (tandem) EO foam -- 1 min or > BLE;  Sharpened Romberg EC foam -- RLE back 4sec or <, LLE back 3sec or <      Standardized Balance Assessment   Standardized Balance Assessment  Dynamic Gait Index      Dynamic Gait Index   Level Surface  Normal    Change in Gait Speed  Normal    Gait with Horizontal Head  Turns  Normal    Gait with Vertical Head Turns  Normal    Gait and Pivot Turn  Normal    Step Over Obstacle  Normal    Step Around Obstacles  Normal    Steps  Normal    Total Score  24      Functional Gait  Assessment   Gait assessed   Yes    Gait Level Surface  Walks 20 ft in less than 5.5 sec, no assistive devices, good speed, no evidence for imbalance, normal gait pattern, deviates no more than 6 in outside of the 12 in walkway width.    Change in Gait Speed  Able to smoothly change walking speed without loss of balance or gait deviation. Deviate no more than 6 in outside of the 12 in walkway width.    Gait with Horizontal Head Turns  Performs head turns smoothly with no change in gait. Deviates no more than 6 in outside 12 in walkway width    Gait with Vertical Head Turns  Performs head turns with no change in gait. Deviates no more than 6 in outside 12 in walkway width.    Gait and Pivot Turn  Pivot turns safely within 3 sec and stops quickly with no  loss of balance.    Step Over Obstacle  Is able to step over 2 stacked shoe boxes taped together (9 in total height) without changing gait speed. No evidence of imbalance.    Gait with Narrow Base of Support  Is able to ambulate for 10 steps heel to toe with no staggering.    Gait with Eyes Closed  Walks 20 ft, slow speed, abnormal gait pattern, evidence for imbalance, deviates 10-15 in outside 12 in walkway width. Requires more than 9 sec to ambulate 20 ft.    Ambulating Backwards  Walks 20 ft, uses assistive device, slower speed, mild gait deviations, deviates 6-10 in outside 12 in walkway width.    Steps  Alternating feet, no rail.    Total Score  27          PT Education - 02/20/19 1201    Education Details  reassessment findings, updated HEP, discharge to 4 week HEP POC with f/u visit in 4 weeks if needed    Person(s) Educated  Patient    Methods  Explanation;Demonstration;Handout    Comprehension  Verbalized understanding;Returned demonstration       PT Short Term Goals - 02/20/19 1146      PT SHORT TERM GOAL #1   Title  Patient will have improved FGA score by 3 points to 28/30. In order to demo improved dynamic balance with walking.    Baseline  7/6: 27/30    Time  2    Period  Weeks    Status  On-going    Target Date  02/15/19      PT SHORT TERM GOAL #2   Title  Patient will have improved SLS to 15 seconds or > on BLE to demo improved balance and maximize gait.    Baseline  7/20: R SLS: 20 sec, L SLS: 24 sec    Time  2    Period  Weeks    Status  Achieved        PT Long Term Goals - 02/20/19 1149      PT LONG TERM GOAL #1   Title  Patient will have improved tandem stance on foam with EO and EC for 12 seconds or > to  demo improved overall balance and vestibular function.    Baseline  7/6: EO 1 min bil, EC R back 4 sec, L 3 sec or <    Time  3    Period  Weeks    Status  Partially Met      PT LONG TERM GOAL #2   Title  Patient will report reduced overall  dizziness to minimal to none with transitional movements and during head turns with gait to demo improved overall vestibular ocular nerve function and maximize return to PLOF.    Baseline  7/6: Pt reports less, with the exception of sunday-tuesday last week    Time  3    Period  Weeks    Status  Achieved          Plan - 02/20/19 1124    Clinical Impression Statement  Reassessed pt's goals this date. Pt has fully met 2/4 goals and has made progress to 2 goals not fully met. Pt with improvements in balance AEB improvement in FGA and DGI scores, however pt did not fully meet FGA goal of 28/30. Pt with improved SLS and tandem stance scores in the various environments listed: firm surface, foam surface, EO and EC. Pt reports improvement in overall dizziness with mobility, although had a rough few days last week lasting from Sunday-Tuesday and has been "good" since Wednesday. Pt having eyelid weight implantation procedure on 02/24/19, to further reduce irritation in eye and hopefully improve balance. Pt in agreement to discharge to 4 week HEP POC with 1 f/u visit after to maintain gains made with skilled PT and further progress with balance training at home.    Personal Factors and Comorbidities  Comorbidity 1    Examination-Activity Limitations  Locomotion Level    Examination-Participation Restrictions  Community Activity;Yard Work;Other    Stability/Clinical Decision Making  Stable/Uncomplicated    Rehab Potential  Good    PT Frequency  Other (comment)   1 f/u visit in 1 month if needed   PT Duration  4 weeks    PT Treatment/Interventions  ADLs/Self Care Home Management;Canalith Repostioning;DME Instruction;Gait training;Stair training;Functional mobility training;Therapeutic activities;Therapeutic exercise;Balance training;Neuromuscular re-education;Patient/family education;Orthotic Fit/Training;Manual techniques;Passive range of motion;Taping;Vestibular    PT Next Visit Plan  d/c to 4 week HEP  POC, f/u visit in 4 weeks if needed    PT Home Exercise Plan  eval: SLS, tandem stance EO/EC firm surface in corner; 06/25: SLS with EC; 7/6: tandem foam EC, tandem foam head turn R/L and up/down, tandem foam EC head turn R/L and up/down, tandem on firm EC, hip vectors    Consulted and Agree with Plan of Care  Patient       Patient will benefit from skilled therapeutic intervention in order to improve the following deficits and impairments:  Decreased balance, Dizziness, Difficulty walking, Impaired vision/preception, Decreased mobility  Visit Diagnosis: 1. Unsteadiness on feet   2. Dizziness and giddiness        Problem List Patient Active Problem List   Diagnosis Date Noted  . Allergic rhinitis 12/21/2013  . Hyperlipidemia 12/21/2013  . Nephrolithiasis 12/21/2013  . Vitamin D deficiency 12/21/2013  . Hiatal hernia 12/21/2013  . Tinnitus of right ear 02/03/2013    Talbot Grumbling PT, DPT   [place], [place], [address] Phone: [phone]   Fax:  [phone]  Name: Chris Ali MRN: [id number] Date of Birth: May 16, 1961

## 2019-02-22 ENCOUNTER — Encounter (HOSPITAL_COMMUNITY): Payer: 59

## 2019-02-28 ENCOUNTER — Encounter (HOSPITAL_COMMUNITY): Payer: 59

## 2019-03-02 ENCOUNTER — Encounter (HOSPITAL_COMMUNITY): Payer: 59

## 2019-03-23 ENCOUNTER — Ambulatory Visit (HOSPITAL_COMMUNITY): Payer: 59

## 2019-05-17 DIAGNOSIS — G510 Bell's palsy: Secondary | ICD-10-CM | POA: Insufficient documentation

## 2019-07-25 ENCOUNTER — Other Ambulatory Visit: Payer: Self-pay

## 2019-07-25 DIAGNOSIS — Z20822 Contact with and (suspected) exposure to covid-19: Secondary | ICD-10-CM

## 2019-07-27 LAB — NOVEL CORONAVIRUS, NAA: SARS-CoV-2, NAA: NOT DETECTED

## 2019-11-22 ENCOUNTER — Other Ambulatory Visit: Payer: Self-pay

## 2019-11-22 ENCOUNTER — Ambulatory Visit (INDEPENDENT_AMBULATORY_CARE_PROVIDER_SITE_OTHER): Payer: 59 | Admitting: Family Medicine

## 2019-11-22 ENCOUNTER — Encounter: Payer: Self-pay | Admitting: Family Medicine

## 2019-11-22 VITALS — BP 123/79 | HR 88 | Temp 97.1°F | Ht 66.0 in | Wt 156.0 lb

## 2019-11-22 DIAGNOSIS — Z125 Encounter for screening for malignant neoplasm of prostate: Secondary | ICD-10-CM

## 2019-11-22 DIAGNOSIS — E782 Mixed hyperlipidemia: Secondary | ICD-10-CM

## 2019-11-22 DIAGNOSIS — Z0000 Encounter for general adult medical examination without abnormal findings: Secondary | ICD-10-CM

## 2019-11-22 DIAGNOSIS — E559 Vitamin D deficiency, unspecified: Secondary | ICD-10-CM | POA: Diagnosis not present

## 2019-11-22 DIAGNOSIS — D333 Benign neoplasm of cranial nerves: Secondary | ICD-10-CM | POA: Diagnosis not present

## 2019-11-22 DIAGNOSIS — R011 Cardiac murmur, unspecified: Secondary | ICD-10-CM

## 2019-11-22 MED ORDER — ATORVASTATIN CALCIUM 20 MG PO TABS: 20.0000 mg | ORAL_TABLET | Freq: Every day | ORAL | 3 refills | Status: DC

## 2019-11-22 NOTE — Patient Instructions (Signed)
Heart Murmur A heart murmur is an extra sound that is caused by chaotic blood flow through the valves of the heart. The murmur can be heard as a "hum" or "whoosh" sound when blood flows through the heart. There are two types of heart murmurs:  Innocent (benign) murmurs. Most people with this type of heart murmur do not have a heart problem. Many children have innocent heart murmurs. Your health care provider may suggest some basic tests to find out whether your murmur is an innocent murmur. If an innocent heart murmur is found, there is no need for further tests or treatment and no need to restrict activities or stop playing sports.  Abnormal murmurs. These types of murmurs can occur in children and adults. Abnormal murmurs may be a sign of a more serious heart condition, such as a heart defect present at birth (congenital defect) or heart valve disease. What are the causes?  The heart has four areas called chambers. Valves separate the upper and lower chambers from each other (tricuspid valve and mitral valve) and separate the lower chambers of the heart from pathways that lead away from the heart (aortic valve and pulmonary valve). Normally, the valves open to let blood flow through or out of your heart, and then they shut to keep the blood from flowing backward. This condition is caused by heart valves that are not working properly.  In children, abnormal heart murmurs are typically caused by congenital defects.  In adults, abnormal murmurs are usually caused by heart valve problems from disease, infection, or aging. This condition may also be caused by:  Pregnancy.  Fever.  Overactive thyroid gland.  Anemia.  Exercise.  Rapid growth spurts (in children). What are the signs or symptoms? Innocent murmurs do not cause symptoms, and many people with abnormal murmurs may not have symptoms. If symptoms do develop, they may include:  Shortness of breath.  Blue coloring of the skin,  especially on the fingertips.  Chest pain.  Palpitations, or feeling a fluttering or skipped heartbeat.  Fainting.  Persistent cough.  Getting tired much faster than expected.  Swelling in the abdomen, feet, or ankles. How is this diagnosed? This condition may be diagnosed during a routine physical or other exam. If your health care provider hears a murmur with a stethoscope, he or she will listen for:  Where the murmur is located in your heart.  How long the murmur lasts (duration).  When the murmur is heard during the heartbeat.  How loud the murmur is. This may help the health care provider figure out what is causing the murmur. You may be referred to a heart specialist (cardiologist). You may also have other tests, including:  Electrocardiogram (ECG or EKG). This test measures the electrical activity of your heart.  Echocardiogram. This test uses high frequency sound waves to make pictures of your heart.  MRI or chest X-ray.  Cardiac catheterization. This test looks at blood flow through the arteries around the heart. For children and adults who have an abnormal heart murmur and want to stay active, it is important to:  Complete testing.  Review test results.  Receive recommendations from your health care provider. If heart disease is present, it may not be safe to play or be active. How is this treated? Heart murmurs themselves do not need treatment. In some cases, a heart murmur may go away on its own. If an underlying problem or disease is causing the murmur, you may need treatment. If treatment  is needed, it will depend on the type and severity of the disease or heart problem causing the murmur. Treatment may include:  Medicine.  Surgery.  Dietary and lifestyle changes. Follow these instructions at home:  Talk with your health care provider before participating in sports or other activities that require a lot of effort and energy (are strenuous).  Learn as  much as possible about your condition and any related diseases. Ask your health care provider if you may be at risk for any medical emergencies.  Talk with your health care provider about what symptoms you should look out for.  It is up to you to get your test results. Ask your health care provider, or the department that is doing the test, when your results will be ready.  Keep all follow-up visits as told by your health care provider. This is important. Contact a health care provider if:  You are frequently short of breath.  You feel more tired than usual.  You are having a hard time keeping up with normal activities or fitness routines.  You have swelling in your ankles or feet.  You notice that your heart often beats irregularly.  You develop any new symptoms. Get help right away if:  You have chest pain.  You are having trouble breathing.  You feel light-headed or you pass out.  Your symptoms suddenly get worse. These symptoms may represent a serious problem that is an emergency. Do not wait to see if the symptoms will go away. Get medical help right away. Call your local emergency services (911 in the [place] Do not drive yourself to the hospital. Summary  Normally, the heart valves open to let blood flow through or out of your heart, and then they shut to keep the blood from flowing backward.  A heart murmur is caused by heart valves that are not working properly.  You may need treatment if an underlying problem or disease is causing the heart murmur. Treatment may include medicine, surgery, or dietary and lifestyle changes.  Talk with your health care provider before participating in sports or other activities that require a lot of effort and energy (are strenuous).  Talk with your health care provider about what symptoms you should watch out for. This information is not intended to replace advice given to you by your health care provider. Make sure you discuss any  questions you have with your health care provider. Document Revised: 01/25/2018 Document Reviewed: 01/25/2018 Elsevier Patient Education  2020 Elsevier Inc[employer]  

## 2019-11-22 NOTE — Progress Notes (Signed)
Subjective: CC:est care,  HLD PCP: Janora Norlander, DO FXT:KWIOX Chris Ali is Chris 59 y.o. male presenting to clinic today for:  1.  Hyperlipidemia Patient reports compliance with Lipitor 20 mg daily.  No chest pain, shortness of breath.  He is very physically active and often kayaks.  He tries to maintain good health.  He has had some weight loss since his surgery for acoustic neuroma last summer.  He had lost quite Chris bit of his motor skills of the face and after the surgery he had accumulated quite Chris bit of fluid on the left side of his neck.  He subsequently had an NG tube placed for about Chris week.  He notes he is maintained weight at about 152 pounds at home with nadir at 147 pounds.  2.  Acoustic neuroma, right side Patient status post surgical resection of acoustic neuroma.  He had Chris second surgery to help with the closing of his eyelid on the right.  He is gaining some facial tone back which she is excited about.  He is continuing to adjust to changes that he never thought about prior to this surgery.  He cites that recently he was on Chris kayaking trip where he tipped over in the water.  His eye was protected but unfortunately he did sucking Chris little bit of water into his nose.  He is slowly adjusting to these changes.   ROS: Per HPI  No Known Allergies Past Medical History:  Diagnosis Date  . Allergy   . Hyperlipidemia   . Kidney stone   . Neck pain   . Tinnitus     Current Outpatient Medications:  .  atorvastatin (LIPITOR) 20 MG tablet, Take 1 tablet (20 mg total) by mouth daily., Disp: 90 tablet, Rfl: 3 .  fluticasone (FLONASE) 50 MCG/ACT nasal spray, Place 2 sprays into both nostrils daily., Disp: 16 g, Rfl: 4 .  levocetirizine (XYZAL) 5 MG tablet, Take 5 mg by mouth every evening., Disp: , Rfl:  Social History   Socioeconomic History  . Marital status: Married    Spouse name: Not on file  . Number of children: Not on file  . Years of education: Not on file  . Highest  education level: Not on file  Occupational History  . Not on file  Tobacco Use  . Smoking status: Former Smoker    Types: Cigarettes    Quit date: 02/04/1988    Years since quitting: 31.8  . Smokeless tobacco: Former Systems developer    Quit date: 08/17/1988  Substance and Sexual Activity  . Alcohol use: Yes    Comment: rare  . Drug use: No  . Sexual activity: Not on file  Other Topics Concern  . Not on file  Social History Narrative  . Not on file   Social Determinants of Health   Financial Resource Strain:   . Difficulty of Paying Living Expenses:   Food Insecurity:   . Worried About Charity fundraiser in the Last Year:   . Arboriculturist in the Last Year:   Transportation Needs:   . Film/video editor (Medical):   Marland Kitchen Lack of Transportation (Non-Medical):   Physical Activity:   . Days of Exercise per Week:   . Minutes of Exercise per Session:   Stress:   . Feeling of Stress :   Social Connections:   . Frequency of Communication with Friends and Family:   . Frequency of Social Gatherings with Friends  and Family:   . Attends Religious Services:   . Active Member of Clubs or Organizations:   . Attends Archivist Meetings:   Marland Kitchen Marital Status:   Intimate Partner Violence:   . Fear of Current or Ex-Partner:   . Emotionally Abused:   Marland Kitchen Physically Abused:   . Sexually Abused:    Family History  Problem Relation Age of Onset  . Hypertension Mother   . Cancer Father        prostate  . Cancer Sister        breast  . Heart disease Maternal Grandmother   . Heart attack Maternal Grandmother 51  . Heart disease Maternal Grandfather   . Cancer Paternal Grandfather        colon  . Pancreatic cancer Paternal Grandfather   . Esophageal cancer Neg Hx   . Rectal cancer Neg Hx   . Stomach cancer Neg Hx     Objective: Office vital signs reviewed. BP 123/79   Pulse 88   Temp (!) 97.1 F (36.2 C) (Temporal)   Ht 5' 6" (1.676 m)   Wt 156 lb (70.8 kg)   SpO2 96%    BMI 25.18 kg/m   Physical Examination:  General: Awake, alert, well nourished, No acute distress HEENT: Facial drooping on the right.  He is able to close his right eye fully. Cardio: regular rate and rhythm, S1S2 heard, 1/6 SEM appreciated at RSB. No radiation to carotids Pulm: clear to auscultation bilaterally, no wheezes, rhonchi or rales; normal work of breathing on room air Neck: no carotid bruits. Extremities: warm, well perfused, No edema, cyanosis or clubbing; +2 pulses bilaterally MSK: normal gait and station Neuro: as above  Assessment/ Plan: 59 y.o. male   1. Mixed hyperlipidemia Patient will come in for fasting lipid panel.  Atorvastatin renewed - Lipid panel; Future - CMP14+EGFR; Future - TSH; Future  2. Vitamin D deficiency On OTC supplementation - VITAMIN D 25 Hydroxy (Vit-D Deficiency, Fractures); Future  3. Unilateral vestibular schwannoma (HCC) S/p resection. Recovering well. - CBC; Future  4. Screening for malignant neoplasm of prostate Advised to abstain from sex for 24 hours prior to collection - PSA; Future  5. Heart murmur Offered echocardiogram.  This is apparently new newly recognized heart murmur.  He wishes to hold off on echocardiogram for now.  I will try and come through his records as he is seen cardiology in the past for stress testing.  Perhaps there was an echo obtained previously that would explain I suspect to be an aortic regurgitation.  We discussed worrisome symptoms or signs warranting further evaluation.  He voiced understanding.   No orders of the defined types were placed in this encounter.  No orders of the defined types were placed in this encounter.    Janora Norlander, DO [place] [phone]

## 2019-12-07 ENCOUNTER — Other Ambulatory Visit: Payer: 59

## 2019-12-07 ENCOUNTER — Other Ambulatory Visit: Payer: Self-pay

## 2019-12-07 DIAGNOSIS — E782 Mixed hyperlipidemia: Secondary | ICD-10-CM

## 2019-12-07 DIAGNOSIS — Z125 Encounter for screening for malignant neoplasm of prostate: Secondary | ICD-10-CM

## 2019-12-07 DIAGNOSIS — D333 Benign neoplasm of cranial nerves: Secondary | ICD-10-CM

## 2019-12-07 DIAGNOSIS — E559 Vitamin D deficiency, unspecified: Secondary | ICD-10-CM

## 2019-12-08 LAB — CMP14+EGFR
ALT: 17 IU/L (ref 0–44)
AST: 23 IU/L (ref 0–40)
Albumin/Globulin Ratio: 1.9 (ref 1.2–2.2)
Albumin: 4.5 g/dL (ref 3.8–4.9)
Alkaline Phosphatase: 57 IU/L (ref 39–117)
BUN/Creatinine Ratio: 12 (ref 9–20)
BUN: 12 mg/dL (ref 6–24)
Bilirubin Total: 0.5 mg/dL (ref 0.0–1.2)
CO2: 25 mmol/L (ref 20–29)
Calcium: 9.4 mg/dL (ref 8.7–10.2)
Chloride: 102 mmol/L (ref 96–106)
Creatinine, Ser: 0.97 mg/dL (ref 0.76–1.27)
GFR calc Af Amer: 100 mL/min/{1.73_m2} (ref >59)
GFR calc non Af Amer: 87 mL/min/{1.73_m2} (ref >59)
Globulin, Total: 2.4 g/dL (ref 1.5–4.5)
Glucose: 85 mg/dL (ref 65–99)
Potassium: 4 mmol/L (ref 3.5–5.2)
Sodium: 140 mmol/L (ref 134–144)
Total Protein: 6.9 g/dL (ref 6.0–8.5)

## 2019-12-08 LAB — CBC
Hematocrit: 47.9 % (ref 37.5–51.0)
Hemoglobin: 16.2 g/dL (ref 13.0–17.7)
MCH: 30.6 pg (ref 26.6–33.0)
MCHC: 33.8 g/dL (ref 31.5–35.7)
MCV: 90 fL (ref 79–97)
Platelets: 220 10*3/uL (ref 150–450)
RBC: 5.3 x10E6/uL (ref 4.14–5.80)
RDW: 12.2 % (ref 11.6–15.4)
WBC: 4.5 10*3/uL (ref 3.4–10.8)

## 2019-12-08 LAB — LIPID PANEL
Chol/HDL Ratio: 3 ratio (ref 0.0–5.0)
Cholesterol, Total: 170 mg/dL (ref 100–199)
HDL: 57 mg/dL (ref >39)
LDL Chol Calc (NIH): 93 mg/dL (ref 0–99)
Triglycerides: 114 mg/dL (ref 0–149)
VLDL Cholesterol Cal: 20 mg/dL (ref 5–40)

## 2019-12-08 LAB — PSA: Prostate Specific Ag, Serum: 0.8 ng/mL (ref 0.0–4.0)

## 2019-12-08 LAB — VITAMIN D 25 HYDROXY (VIT D DEFICIENCY, FRACTURES): Vit D, 25-Hydroxy: 24.7 ng/mL — ABNORMAL LOW (ref 30.0–100.0)

## 2019-12-08 LAB — TSH: TSH: 0.465 u[IU]/mL (ref 0.450–4.500)

## 2020-01-02 ENCOUNTER — Telehealth: Payer: Self-pay | Admitting: Family Medicine

## 2020-01-02 NOTE — Telephone Encounter (Signed)
Pt called upset that he received a bill for $208.82 that went toward his deductible. He states his appointment was for a physical and he told staff this. He would like charge refiled to a physical.

## 2020-01-02 NOTE — Telephone Encounter (Signed)
If the note qualifies as a physical, i'd be glad to refile but I suspect we'd have to do a 25 modifier anyway since we discussed non-preventative things like lipids, etc.  Let me know

## 2020-08-15 ENCOUNTER — Ambulatory Visit (INDEPENDENT_AMBULATORY_CARE_PROVIDER_SITE_OTHER): Payer: 59 | Admitting: Family Medicine

## 2020-08-15 ENCOUNTER — Encounter: Payer: Self-pay | Admitting: Family Medicine

## 2020-08-15 ENCOUNTER — Other Ambulatory Visit: Payer: Self-pay

## 2020-08-15 ENCOUNTER — Ambulatory Visit (HOSPITAL_COMMUNITY)
Admission: RE | Admit: 2020-08-15 | Discharge: 2020-08-15 | Disposition: A | Discharge status: Home / Self Care | Payer: 59 | Source: Ambulatory Visit | Attending: Family Medicine | Admitting: Family Medicine

## 2020-08-15 VITALS — BP 125/77 | HR 72 | Ht 66.0 in | Wt 165.0 lb

## 2020-08-15 DIAGNOSIS — N5089 Other specified disorders of the male genital organs: Secondary | ICD-10-CM | POA: Diagnosis present

## 2020-08-15 NOTE — Progress Notes (Signed)
BP 125/77    Pulse 72    Ht 5\' 6"  (1.676 m)    Wt 165 lb (74.8 kg)    SpO2 98%    BMI 26.63 kg/m    Subjective:   Patient ID: Chris Ali, male    DOB: 12-07-1960, 59 y.o.   MRN: [id number]  HPI: Chris Ali is a 59 y.o. male presenting on 08/15/2020 for Testicle Pain (Left testical)   HPI notes that he had left testicular nodule that was noted by him first a couple weeks ago, he feels like this to nodules on the left side. He says just been there but when he does have intercourse then he does have pain after intercourse on that side but it is very short-lived. Patient denies noting the pain was previous. He denies any fevers or chills or dysuria.  Relevant past medical, surgical, family and social history reviewed and updated as indicated. Interim medical history since our last visit reviewed. Allergies and medications reviewed and updated.  Review of Systems  Constitutional: Negative for chills and fever.  Respiratory: Negative for shortness of breath and wheezing.   Cardiovascular: Negative for chest pain and leg swelling.  All other systems reviewed and are negative.   Per HPI unless specifically indicated above   Allergies as of 08/15/2020   No Known Allergies     Medication List       Accurate as of August 15, 2020  3:45 PM. If you have any questions, ask your nurse or doctor.        atorvastatin 20 MG tablet Commonly known as: LIPITOR Take 1 tablet (20 mg total) by mouth daily.   fluticasone 50 MCG/ACT nasal spray Commonly known as: FLONASE Place 2 sprays into both nostrils daily.   levocetirizine 5 MG tablet Commonly known as: XYZAL Take 5 mg by mouth every evening.        Objective:   BP 125/77    Pulse 72    Ht 5\' 6"  (1.676 m)    Wt 165 lb (74.8 kg)    SpO2 98%    BMI 26.63 kg/m   Wt Readings from Last 3 Encounters:  08/15/20 165 lb (74.8 kg)  11/22/19 156 lb (70.8 kg)  01/14/18 173 lb (78.5 kg)    Physical Exam Vitals and nursing  note reviewed.  Constitutional:      General: He is not in acute distress.    Appearance: He is well-developed and well-nourished. He is not diaphoretic.  Eyes:     Extraocular Movements: EOM normal.  Neck:     Thyroid: No thyromegaly.  Cardiovascular:     Pulses: Intact distal pulses.  Genitourinary:    Testes:        Left: Mass (2 probable masses in left testicle) present. Tenderness not present.  Musculoskeletal:        General: No edema.  Skin:    General: Skin is warm and dry.     Findings: No erythema or rash.  Neurological:     Mental Status: He is alert and oriented to person, place, and time.     Coordination: Coordination normal.  Psychiatric:        Mood and Affect: Mood and affect normal.        Behavior: Behavior normal.       Assessment & Plan:   Problem List Items Addressed This Visit   None   Visit Diagnoses    Testicular nodule    -  Primary   Relevant Orders   US SCROTUM W/DOPPLER      ordered stat scrotal ultrasound with Doppler. Follow up plan: Return if symptoms worsen or fail to improve.  Counseling provided for all of the vaccine components Orders Placed This Encounter  Procedures   US SCROTUM W/DOPPLER    Arville Care, MD [facility] 08/15/2020, 3:45 PM

## 2020-08-26 ENCOUNTER — Encounter: Payer: Self-pay | Admitting: Family Medicine

## 2020-09-03 ENCOUNTER — Ambulatory Visit: Payer: 59 | Admitting: Family Medicine

## 2020-09-05 ENCOUNTER — Ambulatory Visit: Payer: 59 | Admitting: Family Medicine

## 2020-12-20 ENCOUNTER — Other Ambulatory Visit: Payer: Self-pay | Admitting: Family Medicine

## 2021-01-21 ENCOUNTER — Other Ambulatory Visit: Payer: Self-pay | Admitting: Family Medicine

## 2021-02-25 ENCOUNTER — Other Ambulatory Visit: Payer: Self-pay | Admitting: Family Medicine

## 2023-12-16 ENCOUNTER — Encounter: Payer: Self-pay | Admitting: Internal Medicine

## 2024-01-20 ENCOUNTER — Telehealth: Payer: Self-pay | Admitting: Internal Medicine

## 2024-01-20 ENCOUNTER — Ambulatory Visit

## 2024-01-20 NOTE — Progress Notes (Unsigned)
No egg or soy allergy known to patient  No issues known to pt with past sedation with any surgeries or procedures Patient denies ever being told they had issues or difficulty with intubation  No FH of Malignant Hyperthermia Pt is not on diet pills Pt is not on  home 02  Pt is not on blood thinners  Pt denies issues with constipation  No A fib or A flutter Have any cardiac testing pending--*** Pt instructed to use Singlecare.com or GoodRx for a price reduction on prep   

## 2024-01-20 NOTE — Telephone Encounter (Signed)
 Please see previous message

## 2024-01-20 NOTE — Telephone Encounter (Signed)
 Patient stated call was disconnected with nurse. Patient has been rescheduled for 6/10 at 12:30 pm. Please advise, thank you

## 2024-01-25 ENCOUNTER — Ambulatory Visit (AMBULATORY_SURGERY_CENTER)

## 2024-01-25 VITALS — Ht 67.0 in | Wt 156.0 lb

## 2024-01-25 DIAGNOSIS — Z1211 Encounter for screening for malignant neoplasm of colon: Secondary | ICD-10-CM

## 2024-01-25 MED ORDER — NA SULFATE-K SULFATE-MG SULF 17.5-3.13-1.6 GM/177ML PO SOLN: 1.0000 | Freq: Once | ORAL | 0 refills | Status: AC

## 2024-01-25 NOTE — Progress Notes (Signed)

## 2024-01-26 ENCOUNTER — Encounter: Payer: Self-pay | Admitting: Internal Medicine

## 2024-02-13 NOTE — Progress Notes (Unsigned)
 [facility] History and Physical   Primary Care Physician:  Renato Dorothey HERO, NP   Reason for Procedure:  Colon cancer screening  Plan:    Colonoscopy     HPI: Chris Ali is a 63 y.o. male presenting for a repeat screening colonoscopy, he had a normal exam in 2015.   Past Medical History:  Diagnosis Date   Allergy    Hyperlipidemia    Kidney stone    Neck pain    Tinnitus     Past Surgical History:  Procedure Laterality Date   COLONOSCOPY  12/2013   OPEN REPAIR PERIARTICULAR FRACTURE / DISLOCATION ELBOW Left 08/17/2002    Prior to Admission medications   Medication Sig Start Date End Date Taking? Authorizing Provider  atorvastatin  (LIPITOR) 20 MG tablet TAKE 1 TABLET EVERY DAY NEED OFFICE BEFORE NEXT REFILL 01/21/21   Jolinda Potter M, DO  carboxymethylcellulose (REFRESH TEARS) 0.5 % SOLN Apply 1 drop to eye daily as needed. 01/02/19   [provider]  cetirizine (ZYRTEC) 10 MG tablet Take 10 mg by mouth daily as needed. Patient not taking: Reported on 01/25/2024    [provider]  diphenhydrAMINE (BENADRYL) 25 mg capsule Take 25 mg by mouth as needed. Patient not taking: Reported on 01/25/2024 12/26/18   [provider]  fluticasone  (FLONASE ) 50 MCG/ACT nasal spray Place 2 sprays into both nostrils daily. Patient taking differently: Place 2 sprays into both nostrils daily as needed. 11/11/18   Georgina Nancyann ORN, MD  levocetirizine (XYZAL) 5 MG tablet Take 5 mg by mouth every evening.    [provider]    Current Outpatient Medications  Medication Sig Dispense Refill   atorvastatin  (LIPITOR) 20 MG tablet TAKE 1 TABLET EVERY DAY NEED OFFICE BEFORE NEXT REFILL 30 tablet 0   carboxymethylcellulose (REFRESH TEARS) 0.5 % SOLN Apply 1 drop to eye daily as needed.     cetirizine (ZYRTEC) 10 MG tablet Take 10 mg by mouth daily as needed. (Patient not taking: Reported on 01/25/2024)     diphenhydrAMINE (BENADRYL) 25 mg capsule  Take 25 mg by mouth as needed. (Patient not taking: Reported on 01/25/2024)     fluticasone  (FLONASE ) 50 MCG/ACT nasal spray Place 2 sprays into both nostrils daily. (Patient taking differently: Place 2 sprays into both nostrils daily as needed.) 16 g 4   levocetirizine (XYZAL) 5 MG tablet Take 5 mg by mouth every evening.     No current facility-administered medications for this visit.    Allergies as of 02/14/2024 - Review Complete 01/25/2024  Allergen Reaction Noted   Dust mite extract Other (See Comments) 04/15/2020   Gramineae pollens Other (See Comments) 04/15/2020   Other Other (See Comments) 04/15/2020    Family History  Problem Relation Age of Onset   Hypertension Mother    Cancer Father        prostate   Cancer Sister        breast   Heart disease Maternal Grandmother    Heart attack Maternal Grandmother 51   Heart disease Maternal Grandfather    Cancer Paternal Grandfather        colon   Pancreatic cancer Paternal Grandfather    Esophageal cancer Neg Hx    Rectal cancer Neg Hx    Stomach cancer Neg Hx     Social History   Socioeconomic History   Marital status: Married    Spouse name: Not on file   Number of children: Not on file  Years of education: Not on file   Highest education level: Not on file  Occupational History   Not on file  Tobacco Use   Smoking status: Former    Current packs/day: 0.00    Types: Cigarettes    Quit date: 02/04/1988    Years since quitting: 36.0   Smokeless tobacco: Former    Quit date: 08/17/1988  Vaping Use   Vaping status: Never Used  Substance and Sexual Activity   Alcohol use: Yes    Comment: rare   Drug use: No   Sexual activity: Not on file  Other Topics Concern   Not on file  Social History Narrative   Not on file   Social Drivers of Health   Financial Resource Strain: Not on file  Food Insecurity: Not on file  Transportation Needs: Not on file  Physical Activity: Not on file  Stress: Not on file  Social  Connections: Not on file  Intimate Partner Violence: Not on file    Review of Systems: Positive for *** All other review of systems negative except as mentioned in the HPI.  Physical Exam: Vital signs There were no vitals taken for this visit.  General:   Alert,  Well-developed, well-nourished, pleasant and cooperative in NAD Lungs:  Clear throughout to auscultation.   Heart:  Regular rate and rhythm; no murmurs, clicks, rubs,  or gallops. Abdomen:  Soft, nontender and nondistended. Normal bowel sounds.   Neuro/Psych:  Alert and cooperative. Normal mood and affect. A and O x 3   @Sedonia Kitner  CHARLENA Commander, MD, [facility] [phone] (pager) 02/13/2024 5:32 PM@

## 2024-02-14 ENCOUNTER — Encounter: Payer: Self-pay | Admitting: Internal Medicine

## 2024-02-14 ENCOUNTER — Ambulatory Visit (AMBULATORY_SURGERY_CENTER): Admitting: Internal Medicine

## 2024-02-14 VITALS — BP 119/73 | HR 66 | Temp 97.9°F | Resp 10 | Ht 67.0 in | Wt 156.0 lb

## 2024-02-14 DIAGNOSIS — K644 Residual hemorrhoidal skin tags: Secondary | ICD-10-CM | POA: Diagnosis not present

## 2024-02-14 DIAGNOSIS — Z1211 Encounter for screening for malignant neoplasm of colon: Secondary | ICD-10-CM | POA: Diagnosis present

## 2024-02-14 DIAGNOSIS — K5730 Diverticulosis of large intestine without perforation or abscess without bleeding: Secondary | ICD-10-CM

## 2024-02-14 MED ORDER — SODIUM CHLORIDE 0.9 % IV SOLN: 500.0000 mL | Freq: Once | INTRAVENOUS | Status: DC

## 2024-02-14 NOTE — Op Note (Signed)
 [facility] Patient Name: Chris Ali Procedure Date: 02/14/2024 8:02 AM MRN: [id number] Endoscopist: Lupita FORBES Commander , MD, [id number] Age: 63 Referring MD:  Date of Birth: 07/26/1961 Gender: Male Account #: [id number] Procedure:                Colonoscopy Indications:              Screening for colorectal malignant neoplasm, Last                            colonoscopy: 2015 Medicines:                Monitored Anesthesia Care Procedure:                Pre-Anesthesia Assessment:                           - Prior to the procedure, a History and Physical                            was performed, and patient medications and                            allergies were reviewed. The patient's tolerance of                            previous anesthesia was also reviewed. The risks                            and benefits of the procedure and the sedation                            options and risks were discussed with the patient.                            All questions were answered, and informed consent                            was obtained. Prior Anticoagulants: The patient has                            taken no anticoagulant or antiplatelet agents. ASA                            Grade Assessment: I - A normal, healthy patient.                            After reviewing the risks and benefits, the patient                            was deemed in satisfactory condition to undergo the                            procedure.  After obtaining informed consent, the colonoscope                            was passed under direct vision. Throughout the                            procedure, the patient's blood pressure, pulse, and                            oxygen saturations were monitored continuously. The                            Olympus Scope SN: [id number] was introduced through                            the anus and advanced to the the cecum, identified                             by appendiceal orifice and ileocecal valve. The                            colonoscopy was performed without difficulty. The                            patient tolerated the procedure well. The quality                            of the bowel preparation was good. The ileocecal                            valve, appendiceal orifice, and rectum were                            photographed. The bowel preparation used was SUPREP                            via split dose instruction. Scope In: 8:11:27 AM Scope Out: 8:23:38 AM Scope Withdrawal Time: 0 hours 9 minutes 37 seconds  Total Procedure Duration: 0 hours 12 minutes 11 seconds  Findings:                 The perianal and digital rectal examinations were                            normal. Pertinent negatives include normal prostate                            (size, shape, and consistency).                           Multiple diverticula were found in the sigmoid                            colon and descending colon.  External hemorrhoids were found.                           The exam was otherwise without abnormality on                            direct and retroflexion views. Complications:            No immediate complications. Estimated Blood Loss:     Estimated blood loss: none. Impression:               - Diverticulosis in the sigmoid colon and in the                            descending colon.                           - External hemorrhoids.                           - The examination was otherwise normal on direct                            and retroflexion views.                           - No specimens collected. Recommendation:           - Patient has a contact number available for                            emergencies. The signs and symptoms of potential                            delayed complications were discussed with the                            patient. Return to normal  activities tomorrow.                            Written discharge instructions were provided to the                            patient.                           - Resume previous diet.                           - Continue present medications.                           - Repeat colonoscopy in 10 years for screening                            purposes. Lupita FORBES Commander, MD 02/14/2024 8:35:11 AM This report has been signed electronically.

## 2024-02-14 NOTE — Progress Notes (Signed)
 Pt's states no medical or surgical changes since previsit or office visit.

## 2024-02-14 NOTE — Patient Instructions (Addendum)
 No polyps or cancer seen.  You do have diverticulosis - thickened muscle rings and pouches in the colon wall. Please read the handout about this condition.  Hemorrhoids seen also.  Next routine colonoscopy or other screening test in 10 years - 2035.  I appreciate the opportunity to care for you. Chris CHARLENA Commander, MD, FACG  YOU HAD AN ENDOSCOPIC PROCEDURE TODAY AT THE [facility]:   Refer to the procedure report that was given to you for any specific questions about what was found during the examination.  If the procedure report does not answer your questions, please call your gastroenterologist to clarify.  If you requested that your care partner not be given the details of your procedure findings, then the procedure report has been included in a sealed envelope for you to review at your convenience later.  YOU SHOULD EXPECT: Some feelings of bloating in the abdomen. Passage of more gas than usual.  Walking can help get rid of the air that was put into your GI tract during the procedure and reduce the bloating. If you had a lower endoscopy (such as a colonoscopy or flexible sigmoidoscopy) you may notice spotting of blood in your stool or on the toilet paper. If you underwent a bowel prep for your procedure, you may not have a normal bowel movement for a few days.  Please Note:  You might notice some irritation and congestion in your nose or some drainage.  This is from the oxygen used during your procedure.  There is no need for concern and it should clear up in a day or so.  SYMPTOMS TO REPORT IMMEDIATELY:  Following lower endoscopy (colonoscopy or flexible sigmoidoscopy):  Excessive amounts of blood in the stool  Significant tenderness or worsening of abdominal pains  Swelling of the abdomen that is new, acute  Fever of 100F or higher   For urgent or emergent issues, a gastroenterologist can be reached at any hour by calling [phone]. Do not use MyChart messaging for  urgent concerns.    DIET:  We do recommend a small meal at first, but then you may proceed to your regular diet.  Drink plenty of fluids but you should avoid alcoholic beverages for 24 hours.  ACTIVITY:  You should plan to take it easy for the rest of today and you should NOT DRIVE or use heavy machinery until tomorrow (because of the sedation medicines used during the test).    FOLLOW UP: Our staff will call the number listed on your records the next business day following your procedure.  We will call around 7:15- 8:00 am to check on you and address any questions or concerns that you may have regarding the information given to you following your procedure. If we do not reach you, we will leave a message.     If any biopsies were taken you will be contacted by phone or by letter within the next 1-3 weeks.  Please call us  at [phone] if you have not heard about the biopsies in 3 weeks.    SIGNATURES/CONFIDENTIALITY: You and/or your care partner have signed paperwork which will be entered into your electronic medical record.  These signatures attest to the fact that that the information above on your After Visit Summary has been reviewed and is understood.  Full responsibility of the confidentiality of this discharge information lies with you and/or your care-partner.

## 2024-02-14 NOTE — Progress Notes (Signed)
 Pt resting comfortably. VSS. Airway intact. SBAR complete to RN. All questions answered.

## 2024-02-15 ENCOUNTER — Telehealth: Payer: Self-pay

## 2024-02-15 NOTE — Telephone Encounter (Signed)
 Follow up call to pt, lm for pt to call if having any difficulty with normal activities or eating and drinking.  Also to call if any other questions or concerns.
